# Patient Record
Sex: Male | Born: 1980
Health system: Southern US, Community
[De-identification: ages and names within clinical notes are randomized; demographics above are authoritative.]

## PROBLEM LIST (undated history)

## (undated) DIAGNOSIS — E669 Obesity, unspecified: Secondary | ICD-10-CM

## (undated) DIAGNOSIS — J45909 Unspecified asthma, uncomplicated: Secondary | ICD-10-CM

## (undated) DIAGNOSIS — J301 Allergic rhinitis due to pollen: Secondary | ICD-10-CM

## (undated) DIAGNOSIS — I10 Essential (primary) hypertension: Secondary | ICD-10-CM

## (undated) DIAGNOSIS — N200 Calculus of kidney: Secondary | ICD-10-CM

## (undated) DIAGNOSIS — M1990 Unspecified osteoarthritis, unspecified site: Secondary | ICD-10-CM

## (undated) DIAGNOSIS — B019 Varicella without complication: Secondary | ICD-10-CM

## (undated) HISTORY — DX: Varicella without complication: B01.9

## (undated) HISTORY — DX: Unspecified osteoarthritis, unspecified site: M19.90

## (undated) HISTORY — PX: TONSILECTOMY, ADENOIDECTOMY, BILATERAL MYRINGOTOMY AND TUBES: SHX2538

## (undated) HISTORY — DX: Calculus of kidney: N20.0

## (undated) HISTORY — DX: Unspecified asthma, uncomplicated: J45.909

## (undated) HISTORY — PX: APPENDECTOMY: SHX54

## (undated) HISTORY — DX: Allergic rhinitis due to pollen: J30.1

## (undated) HISTORY — DX: Essential (primary) hypertension: I10

---

## 2016-03-28 ENCOUNTER — Encounter: Payer: Self-pay | Admitting: Family Medicine

## 2016-03-28 ENCOUNTER — Ambulatory Visit (INDEPENDENT_AMBULATORY_CARE_PROVIDER_SITE_OTHER): Payer: 59 | Admitting: Family Medicine

## 2016-03-28 VITALS — BP 150/88 | HR 80 | Ht 73.0 in | Wt >= 6400 oz

## 2016-03-28 DIAGNOSIS — I10 Essential (primary) hypertension: Secondary | ICD-10-CM

## 2016-03-28 MED ORDER — LISINOPRIL-HYDROCHLOROTHIAZIDE 20-12.5 MG PO TABS: 1.0000 | ORAL_TABLET | Freq: Every day | ORAL | 1 refills | Status: DC

## 2016-03-28 NOTE — Patient Instructions (Signed)
Nice to meet you. Regarding to increase your lisinopril to 20 mg. Verlon Au going to keep. HCTZ at 12.5 mg. You will return for lab work in 1 week.

## 2016-03-28 NOTE — Progress Notes (Signed)
Tommi Rumps, MD Phone: [phone]  Arthur Oconnor is a 35 y.o. male who presents today for new patient visit.  HYPERTENSION  Disease Monitoring  Home BP Monitoring not checking consistently, reports that the doctors office it's typically 140/80     Chest pain- no    Dyspnea- no Medications  Compliance-  taking lisinopril-HCTZ. Lightheadedness-  no  Edema- no   Active Ambulatory Problems    Diagnosis Date Noted  . Essential hypertension 03/28/2016   Resolved Ambulatory Problems    Diagnosis Date Noted  . No Resolved Ambulatory Problems   Past Medical History:  Diagnosis Date  . Arthritis   . Asthma   . Chicken pox   . Hay fever   . Hypertension   . Kidney stones     Family History  Problem Relation Age of Onset  . Multiple myeloma Father   . Arthritis Maternal Grandmother   . Arthritis Paternal Grandmother     Social History   Social History  . Marital status: Married    Spouse name: N/A  . Number of children: N/A  . Years of education: N/A   Occupational History  . Not on file.   Social History Main Topics  . Smoking status: Never Smoker  . Smokeless tobacco: Former Systems developer  . Alcohol use Yes     Comment: once a month   . Drug use: No  . Sexual activity: Not on file   Other Topics Concern  . Not on file   Social History Narrative   Works as a Music therapist.     ROS  General:  Negative for nexplained weight loss, fever Skin: Negative for new or changing mole, sore that won't heal HEENT: Positive for chronic tinnitus, Negative for trouble hearing, trouble seeing, mouth sores, hoarseness, change in voice, dysphagia. CV:  Negative for chest pain, dyspnea, edema, palpitations Resp: Negative for cough, dyspnea, hemoptysis GI: Negative for nausea, vomiting, diarrhea, constipation, abdominal pain, melena, hematochezia. GU: Negative for dysuria, incontinence, urinary hesitance, hematuria, vaginal or penile discharge, polyuria, sexual  difficulty, lumps in testicle or breasts MSK: Negative for muscle cramps or aches, joint pain or swelling Neuro: Negative for headaches, weakness, numbness, dizziness, passing out/fainting Psych: Negative for depression, anxiety, memory problems  Objective  Physical Exam Vitals:   03/28/16 0808  BP: (!) 150/88  Pulse: 80    BP Readings from Last 3 Encounters:  03/28/16 (!) 150/88   Wt Readings from Last 3 Encounters:  03/28/16 (!) 478 lb (216.8 kg)    Physical Exam  Constitutional: He is well-developed, well-nourished, and in no distress.  HENT:  Head: Normocephalic and atraumatic.  Mouth/Throat: Oropharynx is clear and moist. No oropharyngeal exudate.  Eyes: Conjunctivae are normal. Pupils are equal, round, and reactive to light.  Cardiovascular: Normal rate, regular rhythm and normal heart sounds.   Pulmonary/Chest: Effort normal and breath sounds normal.  Abdominal: Soft. Bowel sounds are normal. He exhibits no distension. There is no tenderness. There is no rebound and no guarding.  Musculoskeletal: He exhibits no edema.  Neurological: He is alert. Gait normal.  Skin: Skin is warm and dry.  Psychiatric: Mood and affect normal.     Assessment/Plan:   Essential hypertension Not at goal. We will increase lisinopril to 20 mg. Keep HCTZ at 12.5 mg. I advised that he needs lab work today to evaluate his kidney function and electrolytes given that this has not been checked in some time and that he is on an ACEI though  he wanted to defer this to next week. We will check fasting lab work next week at his request. He will have a nurse visit at that time as well to recheck his blood pressure.   Orders Placed This Encounter  Procedures  . Comp Met (CMET)    Standing Status:   Future    Standing Expiration Date:   03/28/2017  . HgB A1c    Standing Status:   Future    Standing Expiration Date:   03/28/2017  . Lipid Profile    Standing Status:   Future    Standing Expiration  Date:   03/28/2017    Meds ordered this encounter  Medications  . DISCONTD: lisinopril-hydrochlorothiazide (PRINZIDE,ZESTORETIC) 10-12.5 MG tablet    Sig: Take 1 tablet by mouth daily.  Marland Kitchen lisinopril-hydrochlorothiazide (ZESTORETIC) 20-12.5 MG tablet    Sig: Take 1 tablet by mouth daily.    Dispense:  90 tablet    Refill:  1     Tommi Rumps, MD [place]

## 2016-03-28 NOTE — Assessment & Plan Note (Signed)
Not at goal. We will increase lisinopril to 20 mg. Keep HCTZ at 12.5 mg. I advised that he needs lab work today to evaluate his kidney function and electrolytes given that this has not been checked in some time and that he is on an ACEI though he wanted to defer this to next week. We will check fasting lab work next week at his request. He will have a nurse visit at that time as well to recheck his blood pressure.

## 2016-03-29 ENCOUNTER — Encounter: Payer: Self-pay | Admitting: Family Medicine

## 2016-04-10 ENCOUNTER — Other Ambulatory Visit: Payer: 59

## 2016-04-17 ENCOUNTER — Other Ambulatory Visit (INDEPENDENT_AMBULATORY_CARE_PROVIDER_SITE_OTHER): Payer: 59

## 2016-04-17 ENCOUNTER — Other Ambulatory Visit: Payer: 59

## 2016-04-17 ENCOUNTER — Ambulatory Visit (INDEPENDENT_AMBULATORY_CARE_PROVIDER_SITE_OTHER): Payer: 59

## 2016-04-17 VITALS — BP 138/78 | HR 70 | Resp 18

## 2016-04-17 DIAGNOSIS — I10 Essential (primary) hypertension: Secondary | ICD-10-CM

## 2016-04-17 LAB — COMPREHENSIVE METABOLIC PANEL
ALK PHOS: 50 U/L (ref 39–117)
ALT: 25 U/L (ref 0–53)
AST: 20 U/L (ref 0–37)
Albumin: 4.6 g/dL (ref 3.5–5.2)
BILIRUBIN TOTAL: 0.7 mg/dL (ref 0.2–1.2)
BUN: 10 mg/dL (ref 6–23)
CO2: 31 meq/L (ref 19–32)
CREATININE: 0.82 mg/dL (ref 0.40–1.50)
Calcium: 9.8 mg/dL (ref 8.4–10.5)
Chloride: 98 mEq/L (ref 96–112)
GFR: 114.97 mL/min (ref >60.00)
Glucose, Bld: 97 mg/dL (ref 70–99)
Potassium: 4.7 mEq/L (ref 3.5–5.1)
Sodium: 137 mEq/L (ref 135–145)
TOTAL PROTEIN: 7.4 g/dL (ref 6.0–8.3)

## 2016-04-17 LAB — LIPID PANEL
CHOL/HDL RATIO: 3
Cholesterol: 144 mg/dL (ref 0–200)
HDL: 46.5 mg/dL (ref >39.00)
LDL CALC: 75 mg/dL (ref 0–99)
NonHDL: 97.97
TRIGLYCERIDES: 115 mg/dL (ref 0.0–149.0)
VLDL: 23 mg/dL (ref 0.0–40.0)

## 2016-04-17 LAB — HEMOGLOBIN A1C: Hgb A1c MFr Bld: 5.3 % (ref 4.6–6.5)

## 2016-04-17 NOTE — Progress Notes (Signed)
Blood pressure is acceptable. He should continue his current medication.  Marikay AlarEric Shelsie Tijerino, M.D.

## 2016-04-17 NOTE — Progress Notes (Signed)
Patient came in for BP check.  Check BP in right upper extremities.  Patient started new job last night on nights, started taking BP meds at 2pm for that switch yesterday.  Has not checked BP at home at all. Feeling fine with increased dose from appt on the 10/24.

## 2016-04-20 ENCOUNTER — Ambulatory Visit: Payer: Self-pay | Admitting: Family Medicine

## 2016-04-20 NOTE — Progress Notes (Signed)
Message given to patient.

## 2016-09-14 DIAGNOSIS — H52223 Regular astigmatism, bilateral: Secondary | ICD-10-CM | POA: Diagnosis not present

## 2016-09-18 ENCOUNTER — Telehealth: Payer: Self-pay | Admitting: Family Medicine

## 2016-09-18 ENCOUNTER — Encounter: Payer: Self-pay | Admitting: Family Medicine

## 2016-09-18 NOTE — Telephone Encounter (Signed)
Please find out what pharmacy he wants this sent to and get him set up for follow-up. Thanks.

## 2016-09-18 NOTE — Telephone Encounter (Signed)
error 

## 2016-09-18 NOTE — Telephone Encounter (Signed)
Last OV 03/28/16 last filled 03/28/16 90 1rf patient does not have follow up scheduled

## 2016-09-19 MED ORDER — LISINOPRIL-HYDROCHLOROTHIAZIDE 20-12.5 MG PO TABS: 1.0000 | ORAL_TABLET | Freq: Every day | ORAL | 1 refills | Status: DC

## 2016-09-19 NOTE — Telephone Encounter (Signed)
Left message to return call 

## 2016-09-19 NOTE — Telephone Encounter (Signed)
Patient will call back to schedule apppointment, he uses [facility]

## 2017-03-20 ENCOUNTER — Encounter: Payer: Self-pay | Admitting: Family Medicine

## 2017-03-20 ENCOUNTER — Ambulatory Visit (INDEPENDENT_AMBULATORY_CARE_PROVIDER_SITE_OTHER): Payer: 59 | Admitting: Family Medicine

## 2017-03-20 VITALS — BP 124/84 | HR 73 | Temp 98.8°F | Wt >= 6400 oz

## 2017-03-20 DIAGNOSIS — I10 Essential (primary) hypertension: Secondary | ICD-10-CM | POA: Diagnosis not present

## 2017-03-20 DIAGNOSIS — J4520 Mild intermittent asthma, uncomplicated: Secondary | ICD-10-CM | POA: Diagnosis not present

## 2017-03-20 DIAGNOSIS — J45909 Unspecified asthma, uncomplicated: Secondary | ICD-10-CM | POA: Insufficient documentation

## 2017-03-20 MED ORDER — LISINOPRIL-HYDROCHLOROTHIAZIDE 20-12.5 MG PO TABS: 1.0000 | ORAL_TABLET | Freq: Every day | ORAL | 3 refills | Status: DC

## 2017-03-20 MED ORDER — ALBUTEROL SULFATE HFA 108 (90 BASE) MCG/ACT IN AERS: 2.0000 | INHALATION_SPRAY | Freq: Four times a day (QID) | RESPIRATORY_TRACT | 0 refills | Status: DC | PRN

## 2017-03-20 NOTE — Assessment & Plan Note (Signed)
BMI greater than 60. Encouraged dietary changes and exercise. Dietary guidelines provided. Patient has thought about bariatric surgery though he does not have the time to take off at this time. He is working his way towards that.

## 2017-03-20 NOTE — Assessment & Plan Note (Signed)
Improved on recheck. Plan for fasting labs next month. Patient deferred lab testing for renal function and electrolytes today.

## 2017-03-20 NOTE — Patient Instructions (Signed)
Nice to see you. Please monitor your asthma and if it worsens please let us know. Please work on diet and exercise changes to help you lose weight and control her blood pressure.  Diet Recommendations  Starchy (carb) foods: Bread, rice, pasta, potatoes, corn, cereal, grits, crackers, bagels, muffins, all baked goods.  (Fruits, milk, and yogurt also have carbohydrate, but most of these foods will not spike your blood sugar as the starchy foods will.)  A few fruits do cause high blood sugars; use small portions of bananas (limit to 1/2 at a time), grapes, watermelon, oranges, and most tropical fruits.    Protein foods: Meat, fish, poultry, eggs, dairy foods, and beans such as pinto and kidney beans (beans also provide carbohydrate).   1. Eat at least 3 meals and 1-2 snacks per day. Never go more than 4-5 hours while awake without eating. Eat breakfast within the first hour of getting up.   2. Limit starchy foods to TWO per meal and ONE per snack. ONE portion of a starchy  food is equal to the following:   - ONE slice of bread (or its equivalent, such as half of a hamburger bun).   - 1/2 cup of a "scoopable" starchy food such as potatoes or rice.   - 15 grams of carbohydrate as shown on food label.  3. Include at every meal: a protein food, a carb food, and vegetables and/or fruit.   - Obtain twice the volume of veg's as protein or carbohydrate foods for both lunch and dinner.   - Fresh or frozen veg's are best.   - Keep frozen veg's on hand for a quick vegetable serving.

## 2017-03-20 NOTE — Assessment & Plan Note (Signed)
Mild symptoms. Continue as needed albuterol. If he has increasing symptoms he will let us know.

## 2017-03-20 NOTE — Progress Notes (Signed)
  Tommi Rumps, MD Phone: [phone]  Arthur Oconnor is a 35 y.o. male who presents today for f/u.  Hypertension: Taking lisinopril, HCTZ. No chest pain, shortness of breath, or edema. Not checking consistently at home.  Asthma: Long history of this. Not on any controller medications. Uses his pro-air about once a week. No nighttime symptoms. Occasional wheezing during allergy season.  Obesity: Patient doesn't get any exclusive exercise though does go up and down 3 flights of stairs 30-40 times while at work. He has been trying to eat better. Cooking more at home. Healthier substitutes. Chicken as a meat.  PMH: nonsmoker.   ROS see history of present illness  Objective  Physical Exam Vitals:   03/20/17 1527 03/20/17 1559  BP: (!) 144/90 124/84  Pulse: 73   Temp: 98.8 F (37.1 C)   SpO2: 96%     BP Readings from Last 3 Encounters:  03/20/17 124/84  04/17/16 138/78  03/28/16 (!) 150/88   Wt Readings from Last 3 Encounters:  03/20/17 (!) 477 lb 6 oz (216.5 kg)  03/28/16 (!) 478 lb (216.8 kg)    Physical Exam  Constitutional: No distress.  HENT:  Head: Normocephalic and atraumatic.  Cardiovascular: Normal rate, regular rhythm and normal heart sounds.   Pulmonary/Chest: Effort normal and breath sounds normal.  Musculoskeletal: He exhibits no edema.  Neurological: He is alert. Gait normal.  Skin: He is not diaphoretic.     Assessment/Plan: Please see individual problem list.  Asthma Mild symptoms. Continue as needed albuterol. If he has increasing symptoms he will let us know.  Morbid obesity (HCC) BMI greater than 60. Encouraged dietary changes and exercise. Dietary guidelines provided. Patient has thought about bariatric surgery though he does not have the time to take off at this time. He is working his way towards that.  Essential hypertension Improved on recheck. Plan for fasting labs next month. Patient deferred lab testing for renal function  and electrolytes today.   Orders Placed This Encounter  Procedures  . Comp Met (CMET)    Standing Status:   Future    Standing Expiration Date:   03/20/2018  . Lipid Profile    Standing Status:   Future    Standing Expiration Date:   03/20/2018  . Hemoglobin A1c    Standing Status:   Future    Standing Expiration Date:   03/20/2018    Meds ordered this encounter  Medications  . lisinopril-hydrochlorothiazide (ZESTORETIC) 20-12.5 MG tablet    Sig: Take 1 tablet by mouth daily.    Dispense:  90 tablet    Refill:  3  . albuterol (PROVENTIL HFA;VENTOLIN HFA) 108 (90 Base) MCG/ACT inhaler    Sig: Inhale 2 puffs into the lungs every 6 (six) hours as needed for wheezing or shortness of breath.    Dispense:  1 Inhaler    Refill:  0     Tommi Rumps, MD [place]

## 2017-04-20 ENCOUNTER — Other Ambulatory Visit: Payer: 59

## 2017-09-18 ENCOUNTER — Ambulatory Visit: Payer: 59 | Admitting: Family Medicine

## 2018-04-01 ENCOUNTER — Other Ambulatory Visit: Payer: Self-pay | Admitting: Family Medicine

## 2018-04-01 NOTE — Telephone Encounter (Signed)
Requested medication (s) are due for refill today: Yes  Requested medication (s) are on the active medication list: Yes  Last refill:  03/20/17  Future visit scheduled: Yes  Notes to clinic:  Expired, labs overdue, unable to refill per protocol     Requested Prescriptions  Pending Prescriptions Disp Refills   lisinopril-hydrochlorothiazide (ZESTORETIC) 20-12.5 MG tablet 90 tablet 3    Sig: Take 1 tablet by mouth daily.     Cardiovascular:  ACEI + Diuretic Combos Failed - 04/01/2018  8:30 AM      Failed - Na in normal range and within 180 days    Sodium  Date Value Ref Range Status  04/17/2016 137 135 - 145 mEq/L Final         Failed - K in normal range and within 180 days    Potassium  Date Value Ref Range Status  04/17/2016 4.7 3.5 - 5.1 mEq/L Final         Failed - Cr in normal range and within 180 days    Creatinine, Ser  Date Value Ref Range Status  04/17/2016 0.82 0.40 - 1.50 mg/dL Final         Failed - Ca in normal range and within 180 days    Calcium  Date Value Ref Range Status  04/17/2016 9.8 8.4 - 10.5 mg/dL Final         Failed - Valid encounter within last 6 months    Recent Outpatient Visits          1 year ago Essential hypertension   [facility] [place] Glori Luis, MD   2 years ago Essential hypertension   [facility] [place] Glori Luis, MD      Future Appointments            In 2 months Birdie Sons Yehuda Mao, MD [facility]           Passed - Patient is not pregnant      Passed - Last BP in normal range    BP Readings from Last 1 Encounters:  03/20/17 124/84

## 2018-04-01 NOTE — Telephone Encounter (Signed)
Copied from CRM [id number]. Topic: Quick Communication - See Telephone Encounter >> Apr 01, 2018  8:16 AM Trula Slade wrote: CRM for notification. See Telephone encounter for: 04/01/18. Patient would like a refill on his lisinopril-hydrochlorothiazide (ZESTORETIC) 20-12.5 MG tablet medication until his meds refill appt on 06/21/18, and he would like to send it to his preferred pharmacy [facility].

## 2018-04-01 NOTE — Telephone Encounter (Signed)
Routed incorrectly , resending

## 2018-04-02 MED ORDER — LISINOPRIL-HYDROCHLOROTHIAZIDE 20-12.5 MG PO TABS: 1.0000 | ORAL_TABLET | Freq: Every day | ORAL | 3 refills | Status: DC

## 2018-06-21 ENCOUNTER — Encounter: Payer: Self-pay | Admitting: Family Medicine

## 2018-06-21 ENCOUNTER — Ambulatory Visit: Payer: 59 | Admitting: Family Medicine

## 2018-06-21 VITALS — BP 132/86 | HR 65 | Temp 97.4°F | Resp 17 | Ht 73.0 in | Wt >= 6400 oz

## 2018-06-21 DIAGNOSIS — J4520 Mild intermittent asthma, uncomplicated: Secondary | ICD-10-CM

## 2018-06-21 DIAGNOSIS — I10 Essential (primary) hypertension: Secondary | ICD-10-CM

## 2018-06-21 MED ORDER — LISINOPRIL-HYDROCHLOROTHIAZIDE 20-12.5 MG PO TABS: 1.0000 | ORAL_TABLET | Freq: Every day | ORAL | 3 refills | Status: DC

## 2018-06-21 NOTE — Assessment & Plan Note (Signed)
Adequately controlled for age.  Continue current medication.  Check lab work.

## 2018-06-21 NOTE — Assessment & Plan Note (Signed)
Asymptomatic.  Monitor for recurrence. 

## 2018-06-21 NOTE — Assessment & Plan Note (Addendum)
Congratulated on 17 pound weight loss.  He will continue diet and exercise.

## 2018-06-21 NOTE — Progress Notes (Signed)
  Tommi Rumps, MD Phone: [phone]  Arthur Oconnor is a 38 y.o. male who presents today for follow-up.  CC: Hypertension, obesity, asthma  Hypertension: Not checking at home.  Taking lisinopril/HCTZ.  No chest pain, shortness breath, or edema.  Obesity: Patient made recent changes to exercise and diet.  Patient has been exercising more with walking.  He cut down on coffee.  He has been taking a salad for his second meal of the day.  He works third shift.  He notes taking the salad is help with the lethargy he was having previously.  No soda or sweet tea.  Asthma: He notes this is very well controlled recently.  He is not had any issues.  No cough, wheezing, or shortness of breath.  Social History   Tobacco Use  Smoking Status Never Smoker  Smokeless Tobacco Former User     ROS see history of present illness  Objective  Physical Exam Vitals:   06/21/18 1557  BP: 132/86  Pulse: 65  Resp: 17  Temp: (!) 97.4 F (36.3 C)  SpO2: 97%    BP Readings from Last 3 Encounters:  06/21/18 132/86  03/20/17 124/84  04/17/16 138/78   Wt Readings from Last 3 Encounters:  06/21/18 (!) 460 lb (208.7 kg)  03/20/17 (!) 477 lb 6 oz (216.5 kg)  03/28/16 (!) 478 lb (216.8 kg)    Physical Exam Constitutional:      General: He is not in acute distress.    Appearance: He is not diaphoretic.  Cardiovascular:     Rate and Rhythm: Normal rate and regular rhythm.     Heart sounds: Normal heart sounds.  Pulmonary:     Effort: Pulmonary effort is normal.     Breath sounds: Normal breath sounds.  Musculoskeletal:     Right lower leg: No edema.     Left lower leg: No edema.  Skin:    General: Skin is warm and dry.  Neurological:     Mental Status: He is alert.      Assessment/Plan: Please see individual problem list.  Essential hypertension Adequately controlled for age.  Continue current medication.  Check lab work.  Asthma Asymptomatic.  Monitor for  recurrence.  Morbid obesity ([place]) Congratulated on 17 pound weight loss.  He will continue diet and exercise.   Health maintenance: Patient reports tetanus vaccination in 2015 or 2016.  Orders Placed This Encounter  Procedures  . Comp Met (CMET)  . Lipid panel  . HgB A1c    Meds ordered this encounter  Medications  . lisinopril-hydrochlorothiazide (ZESTORETIC) 20-12.5 MG tablet    Sig: Take 1 tablet by mouth daily.    Dispense:  90 tablet    Refill:  [place], MD [place]

## 2018-06-21 NOTE — Patient Instructions (Addendum)
Nice to see you. Please continue diet and exercise We will check lab work today and contact you with the results. 

## 2018-06-22 LAB — COMPREHENSIVE METABOLIC PANEL
AG Ratio: 1.5 (calc) (ref 1.0–2.5)
ALBUMIN MSPROF: 4.6 g/dL (ref 3.6–5.1)
ALKALINE PHOSPHATASE (APISO): 53 U/L (ref 40–115)
ALT: 24 U/L (ref 9–46)
AST: 20 U/L (ref 10–40)
BUN: 11 mg/dL (ref 7–25)
CO2: 26 mmol/L (ref 20–32)
Calcium: 9.7 mg/dL (ref 8.6–10.3)
Chloride: 99 mmol/L (ref 98–110)
Creat: 0.76 mg/dL (ref 0.60–1.35)
GLUCOSE: 89 mg/dL (ref 65–99)
Globulin: 3.1 g/dL (calc) (ref 1.9–3.7)
POTASSIUM: 3.8 mmol/L (ref 3.5–5.3)
Sodium: 138 mmol/L (ref 135–146)
TOTAL PROTEIN: 7.7 g/dL (ref 6.1–8.1)
Total Bilirubin: 0.8 mg/dL (ref 0.2–1.2)

## 2018-06-22 LAB — LIPID PANEL
CHOL/HDL RATIO: 3.5 (calc) (ref <5.0)
CHOLESTEROL: 159 mg/dL (ref <200)
HDL: 45 mg/dL (ref >40)
LDL CHOLESTEROL (CALC): 93 mg/dL
Non-HDL Cholesterol (Calc): 114 mg/dL (calc) (ref <130)
Triglycerides: 114 mg/dL (ref <150)

## 2018-06-22 LAB — HEMOGLOBIN A1C
Hgb A1c MFr Bld: 5.4 % of total Hgb (ref <5.7)
MEAN PLASMA GLUCOSE: 108 (calc)
eAG (mmol/L): 6 (calc)

## 2018-06-24 ENCOUNTER — Encounter: Payer: Self-pay | Admitting: Family Medicine

## 2018-09-19 ENCOUNTER — Encounter: Payer: Self-pay | Admitting: Family Medicine

## 2018-09-20 NOTE — Telephone Encounter (Signed)
Sent to PCP to advise 

## 2018-12-20 ENCOUNTER — Ambulatory Visit: Payer: 59 | Admitting: Family Medicine

## 2019-01-07 DIAGNOSIS — Z20828 Contact with and (suspected) exposure to other viral communicable diseases: Secondary | ICD-10-CM | POA: Diagnosis not present

## 2019-03-13 DIAGNOSIS — Z23 Encounter for immunization: Secondary | ICD-10-CM | POA: Diagnosis not present

## 2019-03-26 ENCOUNTER — Other Ambulatory Visit: Payer: Self-pay | Admitting: Family Medicine

## 2019-03-26 DIAGNOSIS — I10 Essential (primary) hypertension: Secondary | ICD-10-CM

## 2019-03-27 NOTE — Addendum Note (Signed)
Addended by: Leone Haven on: 03/27/2019 04:21 PM   Modules accepted: Orders

## 2019-03-27 NOTE — Telephone Encounter (Signed)
Patient has not been seen since January. He needs to have blood work every 6 months for this medication. I have placed an order for a BMET. Please get him scheduled for this in the next week or so. Thanks.

## 2019-03-28 NOTE — Telephone Encounter (Signed)
I called and left a voicemail for the patient to call the office and schedule a lab appointment in the next week or so.  Yarrow Linhart,cma

## 2019-05-28 ENCOUNTER — Encounter: Payer: Self-pay | Admitting: Family Medicine

## 2019-05-28 ENCOUNTER — Other Ambulatory Visit: Payer: Self-pay

## 2019-05-28 ENCOUNTER — Ambulatory Visit (INDEPENDENT_AMBULATORY_CARE_PROVIDER_SITE_OTHER): Payer: No Typology Code available for payment source | Admitting: Family Medicine

## 2019-05-28 DIAGNOSIS — R1013 Epigastric pain: Secondary | ICD-10-CM | POA: Diagnosis not present

## 2019-05-28 DIAGNOSIS — J4520 Mild intermittent asthma, uncomplicated: Secondary | ICD-10-CM | POA: Diagnosis not present

## 2019-05-28 DIAGNOSIS — I10 Essential (primary) hypertension: Secondary | ICD-10-CM | POA: Diagnosis not present

## 2019-05-28 MED ORDER — BLOOD PRESSURE KIT KIT: PACK | 0 refills | Status: DC

## 2019-05-28 MED ORDER — LISINOPRIL-HYDROCHLOROTHIAZIDE 20-12.5 MG PO TABS: 1.0000 | ORAL_TABLET | Freq: Every day | ORAL | 1 refills | Status: DC

## 2019-05-28 MED ORDER — ALBUTEROL SULFATE HFA 108 (90 BASE) MCG/ACT IN AERS: 2.0000 | INHALATION_SPRAY | Freq: Four times a day (QID) | RESPIRATORY_TRACT | 1 refills | Status: DC | PRN

## 2019-05-28 NOTE — Assessment & Plan Note (Addendum)
Likely gastritis related.  Will monitor given that it has improved quite a bit and if it does not continue to improve and resolve he will let us know.  Discussed consistent use of Pepcid or omeprazole over a 2-week timeframe for the symptoms.

## 2019-05-28 NOTE — Assessment & Plan Note (Signed)
Undetermined control.  He will come in for lab work.  He will have a nurse BP check.  We will continue his current medication.

## 2019-05-28 NOTE — Assessment & Plan Note (Signed)
Encouraged continued healthy diet and physical activity.

## 2019-05-28 NOTE — Assessment & Plan Note (Signed)
Rarely has symptoms.  We will refill his albuterol.

## 2019-05-28 NOTE — Progress Notes (Signed)
Virtual Visit via video Note  This visit type was conducted due to national recommendations for restrictions regarding the COVID-19 pandemic (e.g. social distancing).  This format is felt to be most appropriate for this patient at this time.  All issues noted in this document were discussed and addressed.  No physical exam was performed (except for noted visual exam findings with Video Visits).   I connected with Cristina Gong today at  8:30 AM EST by a video enabled telemedicine application and verified that I am speaking with the correct person using two identifiers. Location patient: home Location provider: work Persons participating in the virtual visit: patient, provider  I discussed the limitations, risks, security and privacy concerns of performing an evaluation and management service by telephone and the availability of in person appointments. I also discussed with the patient that there may be a patient responsible charge related to this service. The patient expressed understanding and agreed to proceed.   Reason for visit: follow-up  HPI: HYPERTENSION  Disease Monitoring  Home BP Monitoring not checking Chest pain- no    Dyspnea- no Medications  Compliance-  Taking lisinopril/hctz.  Edema- no  Asthma: Rarely uses albuterol.  No cough, wheezing, shortness of breath, or nighttime symptoms.  Epigastric pain: Patient notes this started after eating ghost pepper salsa on an empty stomach.  He had similar pain years ago when a physician told him that he may have had an ulcer.  He notes he took Tums and has been taking Pepcid intermittently and the discomfort has gone away.  No nausea, vomiting, diarrhea, or blood in his stool.  The pain has resolved though he does still have an occasional discomfort in the area that resolves with burping.  Morbid obesity: Patient is not specifically exercising though he is physically active with a remodel at his house.  He is eating home-cooked meals  with lean meats and vegetables.  He occasionally snacks a seem to be healthy snacks.   ROS: See pertinent positives and negatives per HPI.  Past Medical History:  Diagnosis Date  . Arthritis   . Asthma   . Chicken pox   . Hay fever   . Hypertension   . Kidney stones     Past Surgical History:  Procedure Laterality Date  . TONSILECTOMY, ADENOIDECTOMY, BILATERAL MYRINGOTOMY AND TUBES      Family History  Problem Relation Age of Onset  . Multiple myeloma Father   . Arthritis Maternal Grandmother   . Arthritis Paternal Grandmother     SOCIAL HX: Non-smoker   Current Outpatient Medications:  .  albuterol (VENTOLIN HFA) 108 (90 Base) MCG/ACT inhaler, Inhale 2 puffs into the lungs every 6 (six) hours as needed for wheezing or shortness of breath., Disp: 18 g, Rfl: 1 .  lisinopril-hydrochlorothiazide (ZESTORETIC) 20-12.5 MG tablet, Take 1 tablet by mouth daily., Disp: 90 tablet, Rfl: 1 .  Blood Pressure Monitoring (BLOOD PRESSURE KIT) KIT, Use once daily, Disp: 1 kit, Rfl: 0  EXAM:  VITALS per patient if applicable:  GENERAL: alert, oriented, appears well and in no acute distress  HEENT: atraumatic, conjunttiva clear, no obvious abnormalities on inspection of external nose and ears  NECK: normal movements of the head and neck  LUNGS: on inspection no signs of respiratory distress, breathing rate appears normal, no obvious gross SOB, gasping or wheezing  CV: no obvious cyanosis  MS: moves all visible extremities without noticeable abnormality  PSYCH/NEURO: pleasant and cooperative, no obvious depression or anxiety, speech and  thought processing grossly intact  ASSESSMENT AND PLAN:  Discussed the following assessment and plan:  Essential hypertension Undetermined control.  He will come in for lab work.  He will have a nurse BP check.  We will continue his current medication.  Asthma Rarely has symptoms.  We will refill his albuterol.  Morbid obesity  ([place]) Encouraged continued healthy diet and physical activity.  Epigastric pain Likely gastritis related.  Will monitor given that it has improved quite a bit and if it does not continue to improve and resolve he will let us know.  Discussed consistent use of Pepcid or omeprazole over a 2-week timeframe for the symptoms.    I discussed the assessment and treatment plan with the patient. The patient was provided an opportunity to ask questions and all were answered. The patient agreed with the plan and demonstrated an understanding of the instructions.   The patient was advised to call back or seek an in-person evaluation if the symptoms worsen or if the condition fails to improve as anticipated.    Tommi Rumps, MD

## 2019-05-29 ENCOUNTER — Telehealth: Payer: Self-pay | Admitting: Family Medicine

## 2019-05-29 NOTE — Telephone Encounter (Signed)
Pt will call back to schedule labs and follow up visits.

## 2019-08-10 ENCOUNTER — Ambulatory Visit: Payer: 59 | Attending: Internal Medicine

## 2019-08-10 DIAGNOSIS — Z23 Encounter for immunization: Secondary | ICD-10-CM

## 2019-08-10 NOTE — Progress Notes (Signed)
   Covid-19 Vaccination Clinic  Name:  Kory Rains Meneely    MRN: [id number] DOB: 03/13/1981  08/10/2019  Mr. Hamm was observed post Covid-19 immunization for 15 minutes without incident. He was provided with Vaccine Information Sheet and instruction to access the V-Safe system.   Mr. Donaho was instructed to call 911 with any severe reactions post vaccine: Marland Kitchen Difficulty breathing  . Swelling of face and throat  . A fast heartbeat  . A bad rash all over body  . Dizziness and weakness   Immunizations Administered    Name Date Dose VIS Date Route   Pfizer COVID-19 Vaccine 08/10/2019 12:45 PM 0.3 mL 05/16/2019 Intramuscular   Manufacturer: [employer], [employer]   Lot: [id number]   NDC: [address]-5

## 2019-09-02 ENCOUNTER — Ambulatory Visit: Payer: 59 | Attending: Internal Medicine

## 2019-09-02 DIAGNOSIS — Z23 Encounter for immunization: Secondary | ICD-10-CM

## 2019-09-02 NOTE — Progress Notes (Signed)
   Covid-19 Vaccination Clinic  Name:  Arthur Oconnor    MRN: [id number] DOB: 08-16-1980  09/02/2019  [name] was observed post Covid-19 immunization for 15 minutes without incident. He was provided with Vaccine Information Sheet and instruction to access the V-Safe system.   [name] was instructed to call 911 with any severe reactions post vaccine: Marland Kitchen Difficulty breathing  . Swelling of face and throat  . A fast heartbeat  . A bad rash all over body  . Dizziness and weakness   Immunizations Administered    Name Date Dose VIS Date Route   Pfizer COVID-19 Vaccine 09/02/2019  8:58 AM 0.3 mL 05/16/2019 Intramuscular   Manufacturer: [employer], [employer]   Lot: [id number]   NDC: [address]-4

## 2019-10-31 DIAGNOSIS — R1013 Epigastric pain: Secondary | ICD-10-CM | POA: Diagnosis not present

## 2019-10-31 DIAGNOSIS — R110 Nausea: Secondary | ICD-10-CM | POA: Diagnosis not present

## 2019-12-12 ENCOUNTER — Other Ambulatory Visit: Payer: Self-pay | Admitting: *Deleted

## 2019-12-12 NOTE — Patient Outreach (Signed)
[facility]) Care Management  12/12/2019  Anita Mcadory Strubel 02/03/1981 [id number]  [employer] -[employer]N [employer] Referral Received 12/12/2019 Initial Outreach 12/12/2019  Telephone Screen-Unsuccessful  RN attempted the initial outreach however unsuccessful. RN able to leave a HIPAA approved voice message requesting a call back.  Plan: Will scheduled another outreach call next week for pending services.  Elliot Cousin, RN Care Management Coordinator [facility] Main Office [phone]

## 2019-12-15 ENCOUNTER — Other Ambulatory Visit: Payer: Self-pay | Admitting: *Deleted

## 2019-12-15 NOTE — Patient Outreach (Signed)
[facility]) Care Management  12/15/2019  Arthur Oconnor Jul 22, 1980 [id number]  [facility] Referral Telephone Assessment-Unsuccessful  RN attempted outreach call today however remains unsuccessful. RN able to leave a HIPAA approved voice message requesting a call back.  PLAN: Will scheduled another outreach call over the next week or pending services.   Elliot Cousin, RN Care Management Coordinator [facility] Main Office [phone]

## 2019-12-16 ENCOUNTER — Ambulatory Visit: Payer: Self-pay | Admitting: *Deleted

## 2019-12-18 ENCOUNTER — Telehealth: Payer: Self-pay | Admitting: Family Medicine

## 2019-12-18 DIAGNOSIS — I10 Essential (primary) hypertension: Secondary | ICD-10-CM

## 2019-12-18 MED ORDER — LISINOPRIL-HYDROCHLOROTHIAZIDE 20-12.5 MG PO TABS: 1.0000 | ORAL_TABLET | Freq: Every day | ORAL | 1 refills | Status: DC

## 2019-12-18 NOTE — Telephone Encounter (Signed)
Pt needs a refill on  lisinopril-hydrochlorothiazide (ZESTORETIC) 20-12.5 MG tablet sent to [facility]

## 2019-12-19 ENCOUNTER — Other Ambulatory Visit: Payer: Self-pay | Admitting: *Deleted

## 2019-12-19 NOTE — Patient Outreach (Signed)
[facility]) Care Management  12/19/2019  Arthur Oconnor August 06, 1980 [id number]   Telephone Screen  RN spoke with pt briefly today and introduced the [facility] program and services due to his schedule. RN unable to fully screen all other medical condition at this time and pt opt to received another follow up call at a later date.  Pt receptive to this call as RN explained the purpose for the call.Pt states his provided is managing his blood pressures and declined any THN services at this time to further assist him in managing his ongoing care related. t was appreciative but again decline to to participate with [facility] in managing his HTN at this time. Pt again very grateful and aware his provider will be notified of [facility].  Plan: Will close this care and notify his provider.  Elliot Cousin, RN Care Management Coordinator [facility] Main Office [phone]

## 2020-01-30 ENCOUNTER — Ambulatory Visit: Payer: 59 | Admitting: Family Medicine

## 2020-02-27 ENCOUNTER — Other Ambulatory Visit: Payer: Self-pay

## 2020-02-27 ENCOUNTER — Telehealth (INDEPENDENT_AMBULATORY_CARE_PROVIDER_SITE_OTHER): Payer: 59 | Admitting: Family Medicine

## 2020-02-27 ENCOUNTER — Encounter: Payer: Self-pay | Admitting: Family Medicine

## 2020-02-27 DIAGNOSIS — I10 Essential (primary) hypertension: Secondary | ICD-10-CM | POA: Diagnosis not present

## 2020-02-27 DIAGNOSIS — K219 Gastro-esophageal reflux disease without esophagitis: Secondary | ICD-10-CM | POA: Diagnosis not present

## 2020-02-27 MED ORDER — SEMAGLUTIDE-WEIGHT MANAGEMENT 1 MG/0.5ML ~~LOC~~ SOAJ: 1.0000 mg | SUBCUTANEOUS | 0 refills | Status: AC

## 2020-02-27 MED ORDER — SEMAGLUTIDE-WEIGHT MANAGEMENT 1.7 MG/0.75ML ~~LOC~~ SOAJ: 1.7000 mg | SUBCUTANEOUS | 0 refills | Status: AC

## 2020-02-27 MED ORDER — SEMAGLUTIDE-WEIGHT MANAGEMENT 2.4 MG/0.75ML ~~LOC~~ SOAJ: 2.4000 mg | SUBCUTANEOUS | 0 refills | Status: DC

## 2020-02-27 MED ORDER — SEMAGLUTIDE-WEIGHT MANAGEMENT 0.25 MG/0.5ML ~~LOC~~ SOAJ: 0.2500 mg | SUBCUTANEOUS | 0 refills | Status: AC

## 2020-02-27 MED ORDER — SEMAGLUTIDE-WEIGHT MANAGEMENT 0.5 MG/0.5ML ~~LOC~~ SOAJ: 0.5000 mg | SUBCUTANEOUS | 0 refills | Status: AC

## 2020-02-27 MED ORDER — LISINOPRIL-HYDROCHLOROTHIAZIDE 20-12.5 MG PO TABS: 1.0000 | ORAL_TABLET | Freq: Every day | ORAL | 1 refills | Status: DC

## 2020-02-27 NOTE — Assessment & Plan Note (Addendum)
Discussed continued healthy diet.  Encouraged adding in some exercise.  We will see if his insurance will cover Ozempic as outlined below to help with weight loss.  He denies family or personal history of thyroid cancer, parathyroid cancer, and adrenal gland cancer.  Discussed that they have seen medullary thyroid cancer in rats though this has not been seen in humans.  Discussed there is a theoretical risk and if he develops any thyroid issues, trouble swallowing, or lumps in the anterior portion of his neck he is to let us know.  Discussed nausea is the most common side effect and if that is significantly bothersome and persistent he should let us know.  Discussed if this is not beneficial we could consider bariatric surgery.

## 2020-02-27 NOTE — Progress Notes (Signed)
Virtual Visit via video Note  This visit type was conducted due to national recommendations for restrictions regarding the COVID-19 pandemic (e.g. social distancing).  This format is felt to be most appropriate for this patient at this time.  All issues noted in this document were discussed and addressed.  No physical exam was performed (except for noted visual exam findings with Video Visits).   I connected with Arthur Oconnor today at  8:30 AM EDT by a video enabled telemedicine application or telephone and verified that I am speaking with the correct person using two identifiers. Location patient: home Location provider: work  Persons participating in the virtual visit: patient, provider  I discussed the limitations, risks, security and privacy concerns of performing an evaluation and management service by telephone and the availability of in person appointments. I also discussed with the patient that there may be a patient responsible charge related to this service. The patient expressed understanding and agreed to proceed.  Interactive audio and video telecommunications were attempted between this provider and patient, however failed, due to patient having technical difficulties OR patient did not have access to video capability.  We continued and completed visit with audio only.   Reason for visit: f/u  HPI: HYPERTENSION  Disease Monitoring  Home BP Monitoring not checking Chest pain- no    Dyspnea- no Medications  Compliance-  Taking lisinopril/HCTZ.   Edema- no  Morbid obesity: Patient notes he has been working on his diet quite a bit.  He has cut down on carbs and red meat.  He works an overnight shift that makes it difficult for him to get in specific exercise though he is quite active with house projects and walks a lot with his job.  GERD: Notes this occurred 2 to 3 months ago.  He did a virtual visit through [facility] and they started him on a PPI. He took this for 7 days and has  had no recurrence of symptoms.  He has cut down on coffee intake and that has helped significantly.  Rarely has to take Tums.     ROS: See pertinent positives and negatives per HPI.  Past Medical History:  Diagnosis Date  . Arthritis   . Asthma   . Chicken pox   . Hay fever   . Hypertension   . Kidney stones     Past Surgical History:  Procedure Laterality Date  . TONSILECTOMY, ADENOIDECTOMY, BILATERAL MYRINGOTOMY AND TUBES      Family History  Problem Relation Age of Onset  . Multiple myeloma Father   . Arthritis Maternal Grandmother   . Arthritis Paternal Grandmother     SOCIAL HX: nonsmoker   Current Outpatient Medications:  .  albuterol (VENTOLIN HFA) 108 (90 Base) MCG/ACT inhaler, Inhale 2 puffs into the lungs every 6 (six) hours as needed for wheezing or shortness of breath., Disp: 18 g, Rfl: 1 .  Blood Pressure Monitoring (BLOOD PRESSURE KIT) KIT, Use once daily, Disp: 1 kit, Rfl: 0 .  lisinopril-hydrochlorothiazide (ZESTORETIC) 20-12.5 MG tablet, Take 1 tablet by mouth daily., Disp: 90 tablet, Rfl: 1 .  Semaglutide-Weight Management 0.25 MG/0.5ML SOAJ, Inject 0.25 mg into the skin once a week for 28 days., Disp: 2 mL, Rfl: 0 .  [START ON 03/27/2020] Semaglutide-Weight Management 0.5 MG/0.5ML SOAJ, Inject 0.5 mg into the skin once a week for 28 days., Disp: 2 mL, Rfl: 0 .  [START ON 04/25/2020] Semaglutide-Weight Management 1 MG/0.5ML SOAJ, Inject 1 mg into the skin once  a week for 28 days., Disp: 2 mL, Rfl: 0 .  [START ON 05/24/2020] Semaglutide-Weight Management 1.7 MG/0.75ML SOAJ, Inject 1.7 mg into the skin once a week for 28 days., Disp: 3 mL, Rfl: 0 .  [START ON 06/22/2020] Semaglutide-Weight Management 2.4 MG/0.75ML SOAJ, Inject 2.4 mg into the skin once a week for 28 days., Disp: 3 mL, Rfl: 0  EXAM:  VITALS per patient if applicable:  GENERAL: alert, oriented, appears well and in no acute distress  HEENT: atraumatic, conjunttiva clear, no obvious  abnormalities on inspection of external nose and ears  NECK: normal movements of the head and neck  LUNGS: on inspection no signs of respiratory distress, breathing rate appears normal, no obvious gross SOB, gasping or wheezing  CV: no obvious cyanosis  MS: moves all visible extremities without noticeable abnormality  PSYCH/NEURO: pleasant and cooperative, no obvious depression or anxiety, speech and thought processing grossly intact  ASSESSMENT AND PLAN:  Discussed the following assessment and plan:  Essential hypertension Undetermined control.  He will continue lisinopril HCTZ 1 tablet daily.  He will come in for labs in the next week and a nurse BP check at that time.  Morbid obesity ([place]) Discussed continued healthy diet.  Encouraged adding in some exercise.  We will see if his insurance will cover Ozempic as outlined below to help with weight loss.  He denies family or personal history of thyroid cancer, parathyroid cancer, and adrenal gland cancer.  Discussed that they have seen medullary thyroid cancer in rats though this has not been seen in humans.  Discussed there is a theoretical risk and if he develops any thyroid issues, trouble swallowing, or lumps in the anterior portion of his neck he is to let us know.  Discussed nausea is the most common side effect and if that is significantly bothersome and persistent he should let us know.  Discussed if this is not beneficial we could consider bariatric surgery.  GERD (gastroesophageal reflux disease) Currently asymptomatic.  Encouraged him to continue on his decreased amount of coffee.  If he has recurrence of symptoms he can try PPI and if needed we can do this daily on an ongoing issue.   No orders of the defined types were placed in this encounter.   Meds ordered this encounter  Medications  . lisinopril-hydrochlorothiazide (ZESTORETIC) 20-12.5 MG tablet    Sig: Take 1 tablet by mouth daily.    Dispense:  90 tablet     Refill:  1  . Semaglutide-Weight Management 0.25 MG/0.5ML SOAJ    Sig: Inject 0.25 mg into the skin once a week for 28 days.    Dispense:  2 mL    Refill:  0  . Semaglutide-Weight Management 0.5 MG/0.5ML SOAJ    Sig: Inject 0.5 mg into the skin once a week for 28 days.    Dispense:  2 mL    Refill:  0  . Semaglutide-Weight Management 1 MG/0.5ML SOAJ    Sig: Inject 1 mg into the skin once a week for 28 days.    Dispense:  2 mL    Refill:  0  . Semaglutide-Weight Management 1.7 MG/0.75ML SOAJ    Sig: Inject 1.7 mg into the skin once a week for 28 days.    Dispense:  3 mL    Refill:  0  . Semaglutide-Weight Management 2.4 MG/0.75ML SOAJ    Sig: Inject 2.4 mg into the skin once a week for 28 days.    Dispense:  3  mL    Refill:  0     I discussed the assessment and treatment plan with the patient. The patient was provided an opportunity to ask questions and all were answered. The patient agreed with the plan and demonstrated an understanding of the instructions.   The patient was advised to call back or seek an in-person evaluation if the symptoms worsen or if the condition fails to improve as anticipated.  I provided 16 minutes of non-face-to-face time during this encounter.   Tommi Rumps, MD

## 2020-02-27 NOTE — Assessment & Plan Note (Signed)
Currently asymptomatic.  Encouraged him to continue on his decreased amount of coffee.  If he has recurrence of symptoms he can try PPI and if needed we can do this daily on an ongoing issue.

## 2020-02-27 NOTE — Assessment & Plan Note (Signed)
Undetermined control.  He will continue lisinopril HCTZ 1 tablet daily.  He will come in for labs in the next week and a nurse BP check at that time.

## 2020-03-11 ENCOUNTER — Encounter: Payer: Self-pay | Admitting: Family Medicine

## 2020-03-23 ENCOUNTER — Telehealth: Payer: Self-pay | Admitting: Family Medicine

## 2020-03-23 DIAGNOSIS — I10 Essential (primary) hypertension: Secondary | ICD-10-CM

## 2020-03-23 NOTE — Telephone Encounter (Signed)
This patient was supposed to come in for labs and a nurse BP check previously.  Can you contact him and get him scheduled for this?  It looks like he was never called after his recent virtual visit.  Lab orders were previously placed.

## 2020-03-23 NOTE — Telephone Encounter (Signed)
I called and spoke with the patient to schedule a nurse visit and  BP check and he was driving and stated he would call back to schedule.  Karsynn Deweese,cma

## 2020-03-25 NOTE — Telephone Encounter (Signed)
Please follow-up with the patient to make sure these labs are scheduled.

## 2020-05-05 NOTE — Telephone Encounter (Signed)
Called and LVM for the patient to call back and schedule a lab appt. And a nurse visit for a BP check.  Reginald Mangels,cma

## 2020-05-18 NOTE — Telephone Encounter (Signed)
I called and lvm for the patient to call and schedule a lab appt. And a nurse visit to check his BP.  Dailey Alberson,cma

## 2020-05-24 NOTE — Telephone Encounter (Addendum)
Please try calling him again.  He is overdue for lab work.  If he does not come in for labs I will not be able to refill his blood pressure medication moving forward.  If you cannot reach him please mail a letter advising him of this.  Thanks.

## 2020-05-24 NOTE — Telephone Encounter (Signed)
I called and spoke with the wife and informed her that the patient needs to have labs in order to get refills on BP medication, she stated patient was asleep , he works 3rd shift but she would tell him to call and schedule a lab appointment. Johannah Rozas,cma

## 2020-06-01 NOTE — Addendum Note (Signed)
Addended by: Birdie Sons, Freida Nebel G on: 06/01/2020 10:02 AM   Modules accepted: Orders

## 2020-06-01 NOTE — Telephone Encounter (Signed)
My chart sent to remind patient

## 2020-06-01 NOTE — Telephone Encounter (Signed)
This patient has still not scheduled his labs.  Please contact him to get this scheduled.  Orders placed. His medication can not be refilled moving forward until his labs are completed. I will forward to [facility] as well so that he is aware that the patient can not get refills until his labs are completed.

## 2020-06-10 DIAGNOSIS — Z20822 Contact with and (suspected) exposure to covid-19: Secondary | ICD-10-CM | POA: Diagnosis not present

## 2020-07-19 ENCOUNTER — Other Ambulatory Visit: Payer: Self-pay

## 2020-07-19 ENCOUNTER — Other Ambulatory Visit (INDEPENDENT_AMBULATORY_CARE_PROVIDER_SITE_OTHER): Payer: 59

## 2020-07-19 ENCOUNTER — Encounter: Payer: Self-pay | Admitting: Family Medicine

## 2020-07-19 DIAGNOSIS — I10 Essential (primary) hypertension: Secondary | ICD-10-CM

## 2020-07-19 LAB — COMPREHENSIVE METABOLIC PANEL
ALT: 22 U/L (ref 0–53)
AST: 16 U/L (ref 0–37)
Albumin: 4.3 g/dL (ref 3.5–5.2)
Alkaline Phosphatase: 47 U/L (ref 39–117)
BUN: 11 mg/dL (ref 6–23)
CO2: 33 mEq/L — ABNORMAL HIGH (ref 19–32)
Calcium: 9.4 mg/dL (ref 8.4–10.5)
Chloride: 100 mEq/L (ref 96–112)
Creatinine, Ser: 0.79 mg/dL (ref 0.40–1.50)
GFR: 113.65 mL/min (ref >60.00)
Glucose, Bld: 88 mg/dL (ref 70–99)
Potassium: 4.3 mEq/L (ref 3.5–5.1)
Sodium: 139 mEq/L (ref 135–145)
Total Bilirubin: 0.7 mg/dL (ref 0.2–1.2)
Total Protein: 7.3 g/dL (ref 6.0–8.3)

## 2020-07-19 LAB — LIPID PANEL
Cholesterol: 132 mg/dL (ref 0–200)
HDL: 45.7 mg/dL (ref >39.00)
LDL Cholesterol: 70 mg/dL (ref 0–99)
NonHDL: 85.92
Total CHOL/HDL Ratio: 3
Triglycerides: 82 mg/dL (ref 0.0–149.0)
VLDL: 16.4 mg/dL (ref 0.0–40.0)

## 2020-07-19 LAB — HEMOGLOBIN A1C: Hgb A1c MFr Bld: 5.3 % (ref 4.6–6.5)

## 2020-07-22 ENCOUNTER — Other Ambulatory Visit (HOSPITAL_COMMUNITY): Payer: Self-pay | Admitting: Family Medicine

## 2020-07-22 ENCOUNTER — Telehealth: Payer: Self-pay | Admitting: Family Medicine

## 2020-07-22 MED ORDER — LISINOPRIL-HYDROCHLOROTHIAZIDE 20-12.5 MG PO TABS: 1.0000 | ORAL_TABLET | Freq: Every day | ORAL | 1 refills | Status: DC

## 2020-07-22 NOTE — Telephone Encounter (Signed)
I tried to send the patient a MyChart message though it said it was not deliverable.  Please call him and let him know that he is due for follow-up for the wegovy.  He needs to be scheduled prior to this being refilled.

## 2020-07-22 NOTE — Telephone Encounter (Signed)
-----   Message from Mychart, Generic sent at 07/22/2020  2:37 PM EST ----- Regarding: Your message may not be read Contact: [phone]    ----- Delivery failure of internet email alert  Tickler type: Message Message Id(WMG): [id number] SMTP Response: [id number] Patient: Arthur Oconnor) Internet alert email: [email or web address]     ----- Original WMG message to the patient ----- Sent: 07/22/2020  2:08 PM From: Glori Luis, MD To: Arthur Oconnor Message Type: Patient Medical Advice Request Subject: Question regarding COMPREHENSIVE METABOLIC PANEL You are due for a follow-up for the wegovy refill. Can you call to schedule an in person follow-up and then I will refill to cover until you are seen? I will send in your lisinopril.   ----- Message -----      From:Arthur Oconnor      Sent:07/20/2020  2:05 PM EST        UR:KYHC Birdie Sons, MD   Subject:Question regarding COMPREHENSIVE METABOLIC PANEL  Sounds like a plan.  Speaking of weight and exercise, that is going well.  I am down 16lbs from the last known weigh in at the office (I stepped on the scale quickly before I left the office) but down 28lbs from when I started taking the [facility] and weighing myself at work.  The [facility] certainly curbs my appetite and greatly limits my ability to eat a lot.  Speaking of Wegovy,  I believe I need a refill since I am on the last dose in the series and I need a refill on the Lisinopril.   ----- Message -----      From:Arthur Wittmann Birdie Sons, MD      Sent:07/20/2020  1:35 PM EST        WC:BJSE Arthur Oconnor   Subject:Question regarding COMPREHENSIVE METABOLIC PANEL  Your bicarbonate level (Co2) was just minimally elevated. It is certainly possible this is within lab error range. If you have had recent vomiting that could contribute to it. Sometimes lung issues can contribute and your weight could be causing some restrictive lung issues that could contribute as well.  At this time I do not think there is anything to do for this other than work on diet and exercise to try to lose weight. We can plan on rechecking with your next set of labs.    ----- Message -----      From:Arthur Oconnor      Sent:07/19/2020  4:15 PM EST        GB:TDVV Birdie Sons, MD   Subject:Question regarding COMPREHENSIVE METABOLIC PANEL  I noticed my Co2 level is high.  What could this be from and what can I do to improve this?

## 2020-07-22 NOTE — Telephone Encounter (Signed)
Patient was called and given his results and he is scheduled to follow up with the provider.  Ashiyah Pavlak,cma

## 2020-07-22 NOTE — Telephone Encounter (Signed)
I called and LVM informing the patient that the provider wanted to see him in person to renew his Wegovy, I held a 3:15 spot for the patient this Friday and informed him to call back and let us know if he would make that appointment.  Jeiry Birnbaum,cma

## 2020-07-27 ENCOUNTER — Telehealth: Payer: Self-pay | Admitting: Family Medicine

## 2020-07-27 ENCOUNTER — Other Ambulatory Visit (HOSPITAL_COMMUNITY): Payer: Self-pay | Admitting: Family Medicine

## 2020-07-27 MED ORDER — SEMAGLUTIDE-WEIGHT MANAGEMENT 2.4 MG/0.75ML ~~LOC~~ SOAJ
2.4000 mg | SUBCUTANEOUS | 1 refills | Status: DC
Start: 2020-07-27 — End: 2020-08-31

## 2020-07-27 NOTE — Telephone Encounter (Signed)
LVM for the patient to call back and let us know if he wanted to renew his medication Wegovy by seeing the provider soon or did he want to wait until his appointment in April to renew.  Ivann Trimarco,cma

## 2020-07-27 NOTE — Addendum Note (Signed)
Addended by: Glori Luis on: 07/27/2020 06:29 PM   Modules accepted: Orders

## 2020-07-27 NOTE — Telephone Encounter (Signed)
Patient called in stated that he will be out of medication Wegovy by friday  He is on schedule for 3-29 at 8am

## 2020-08-19 ENCOUNTER — Other Ambulatory Visit: Payer: Self-pay | Admitting: Family Medicine

## 2020-08-19 DIAGNOSIS — I10 Essential (primary) hypertension: Secondary | ICD-10-CM

## 2020-08-27 ENCOUNTER — Other Ambulatory Visit: Payer: Self-pay

## 2020-08-31 ENCOUNTER — Other Ambulatory Visit (HOSPITAL_COMMUNITY): Payer: Self-pay | Admitting: Family Medicine

## 2020-08-31 ENCOUNTER — Other Ambulatory Visit: Payer: Self-pay

## 2020-08-31 ENCOUNTER — Ambulatory Visit (INDEPENDENT_AMBULATORY_CARE_PROVIDER_SITE_OTHER): Payer: 59 | Admitting: Family Medicine

## 2020-08-31 ENCOUNTER — Encounter: Payer: Self-pay | Admitting: Family Medicine

## 2020-08-31 DIAGNOSIS — I10 Essential (primary) hypertension: Secondary | ICD-10-CM | POA: Diagnosis not present

## 2020-08-31 MED ORDER — SEMAGLUTIDE-WEIGHT MANAGEMENT 2.4 MG/0.75ML ~~LOC~~ SOAJ: 2.4000 mg | SUBCUTANEOUS | 2 refills | Status: DC

## 2020-08-31 NOTE — Progress Notes (Signed)
Tommi Rumps, MD Phone: [phone]  Arthur Oconnor is a 40 y.o. male who presents today for f/u.  HYPERTENSION  Disease Monitoring  Home BP Monitoring not checking Chest pain- no    Dyspnea- no Medications  Compliance-  Lisinopril, HCTZ. Edema- no  Obesity: The patient notes he feels as though his weight is down more than the 18 pounds that we have recorded.  He feels as though he was above 460 previously.  Diet is consistently healthy foods with vegetables and fruits.  Not very much junk food.  He has difficulty eating on a normal schedule as he works third shift and has a child so does not get much sleep.  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  He has been on Ozempic since January and notes he has done fairly well with this.  He had a little nausea midway through the ramp-up though that has resolved.  No abdominal pain.  He does have diarrhea for 1.5 days after taking the injection though no other issues with this.  No blood in the stool with diarrhea.  Social History   Tobacco Use  Smoking Status Never Smoker  Smokeless Tobacco Former Systems developer    Current Outpatient Medications on File Prior to Visit  Medication Sig Dispense Refill  . albuterol (VENTOLIN HFA) 108 (90 Base) MCG/ACT inhaler Inhale 2 puffs into the lungs every 6 (six) hours as needed for wheezing or shortness of breath. 18 g 1  . Blood Pressure Monitoring (BLOOD PRESSURE KIT) KIT Use once daily 1 kit 0  . lisinopril-hydrochlorothiazide (ZESTORETIC) 20-12.5 MG tablet TAKE 1 TABLET BY MOUTH ONCE DAILY 90 tablet 1   No current facility-administered medications on file prior to visit.     ROS see history of present illness  Objective  Physical Exam Vitals:   08/31/20 0820  BP: 140/78  Pulse: 74  Temp: 98.5 F (36.9 C)  SpO2: 98%    BP Readings from Last 3 Encounters:  08/31/20 140/78  06/21/18 132/86  03/20/17 124/84   Wt Readings from Last 3 Encounters:   08/31/20 (!) 442 lb 9.6 oz (200.8 kg)  02/27/20 (!) 460 lb (208.7 kg)  05/28/19 (!) 460 lb (208.7 kg)    Physical Exam Constitutional:      General: He is not in acute distress.    Appearance: He is not diaphoretic.  Cardiovascular:     Rate and Rhythm: Normal rate and regular rhythm.     Heart sounds: Normal heart sounds.  Pulmonary:     Effort: Pulmonary effort is normal.     Breath sounds: Normal breath sounds.  Musculoskeletal:     Right lower leg: No edema.     Left lower leg: No edema.  Skin:    General: Skin is warm and dry.  Neurological:     Mental Status: He is alert.      Assessment/Plan: Please see individual problem list.  Problem List Items Addressed This Visit    Essential hypertension    Generally okay control.  He is relatively young and is working on weight loss so his current blood pressure is acceptable at this time.  He will continue to work on diet and exercise and weight loss.  He will continue lisinopril-HCTZ 1 tablet once daily.  He will start checking his blood pressure.  If it starts to run any higher than it is here today he will let us know we can increase his lisinopril dosage.  Morbid obesity ([place])    Weight is trending down.  He remains as active as he is able to with the schedule.  He is eating a healthy diet.  We can continue the Ozempic.  He denies a personal and family history of thyroid cancer, parathyroid cancer, and adrenal gland cancer.  Discussed the risk of medullary thyroid cancer seen in rats studies.  Suspect there is minimal risk in humans.  Advised him to let us know if he develops any issues in his thyroid area in his neck.      Relevant Medications   Semaglutide-Weight Management 2.4 MG/0.75ML SOAJ      This visit occurred during the SARS-CoV-2 public health emergency.  Safety protocols were in place, including screening questions prior to the visit, additional usage of staff PPE, and extensive cleaning of exam room  while observing appropriate contact time as indicated for disinfecting solutions.    Tommi Rumps, MD [facility]

## 2020-08-31 NOTE — Assessment & Plan Note (Signed)
Weight is trending down.  He remains as active as he is able to with the schedule.  He is eating a healthy diet.  We can continue the Ozempic.  He denies a personal and family history of thyroid cancer, parathyroid cancer, and adrenal gland cancer.  Discussed the risk of medullary thyroid cancer seen in rats studies.  Suspect there is minimal risk in humans.  Advised him to let us know if he develops any issues in his thyroid area in his neck.

## 2020-08-31 NOTE — Assessment & Plan Note (Signed)
Generally okay control.  He is relatively young and is working on weight loss so his current blood pressure is acceptable at this time.  He will continue to work on diet and exercise and weight loss.  He will continue lisinopril-HCTZ 1 tablet once daily.  He will start checking his blood pressure.  If it starts to run any higher than it is here today he will let us know we can increase his lisinopril dosage.

## 2020-08-31 NOTE — Patient Instructions (Signed)
Nice to see you. Please continue to work on diet and exercise. Please start monitoring your blood pressure.  If you start to run greater than 140/80 consistently please let us know.

## 2020-09-20 ENCOUNTER — Ambulatory Visit: Payer: 59 | Admitting: Family Medicine

## 2020-09-22 ENCOUNTER — Other Ambulatory Visit: Payer: Self-pay

## 2020-09-22 MED FILL — Semaglutide (Weight Mngmt) Soln Auto-Injector 2.4 MG/0.75ML: SUBCUTANEOUS | 28 days supply | Qty: 3 | Fill #0 | Status: AC

## 2020-09-23 ENCOUNTER — Other Ambulatory Visit: Payer: Self-pay

## 2020-10-21 ENCOUNTER — Telehealth: Payer: Self-pay | Admitting: Family Medicine

## 2020-10-21 DIAGNOSIS — R69 Illness, unspecified: Secondary | ICD-10-CM | POA: Diagnosis not present

## 2020-10-21 NOTE — Telephone Encounter (Signed)
Patient was called and he stated he has an appointment at an urgent care to get tested.  Leno Mathes,cma

## 2020-10-21 NOTE — Telephone Encounter (Signed)
PT is wanting to know if they can have a telephone call done for a order to be place in to have a STD test done asap.

## 2020-10-25 ENCOUNTER — Other Ambulatory Visit: Payer: Self-pay

## 2020-10-25 DIAGNOSIS — Z315 Encounter for genetic counseling: Secondary | ICD-10-CM | POA: Diagnosis not present

## 2020-10-25 DIAGNOSIS — Z31448 Encounter for other genetic testing of male for procreative management: Secondary | ICD-10-CM | POA: Diagnosis not present

## 2020-10-25 MED FILL — Semaglutide (Weight Mngmt) Soln Auto-Injector 2.4 MG/0.75ML: SUBCUTANEOUS | 28 days supply | Qty: 3 | Fill #1 | Status: AC

## 2020-11-16 ENCOUNTER — Ambulatory Visit: Payer: 59 | Admitting: Family Medicine

## 2020-11-19 ENCOUNTER — Other Ambulatory Visit: Payer: Self-pay

## 2020-11-19 MED FILL — Semaglutide (Weight Mngmt) Soln Auto-Injector 2.4 MG/0.75ML: SUBCUTANEOUS | 28 days supply | Qty: 3 | Fill #0 | Status: AC

## 2020-12-01 ENCOUNTER — Ambulatory Visit: Payer: 59

## 2020-12-01 ENCOUNTER — Ambulatory Visit: Payer: 59 | Admitting: Family Medicine

## 2020-12-01 ENCOUNTER — Ambulatory Visit
Admission: RE | Admit: 2020-12-01 | Discharge: 2020-12-01 | Disposition: A | Discharge status: Home / Self Care | Payer: 59 | Source: Ambulatory Visit | Attending: Family Medicine | Admitting: Family Medicine

## 2020-12-01 ENCOUNTER — Encounter: Payer: Self-pay | Admitting: Family Medicine

## 2020-12-01 ENCOUNTER — Other Ambulatory Visit: Payer: Self-pay | Admitting: Family Medicine

## 2020-12-01 ENCOUNTER — Other Ambulatory Visit: Payer: Self-pay

## 2020-12-01 VITALS — BP 110/80 | HR 82 | Temp 98.3°F | Ht 73.0 in | Wt >= 6400 oz

## 2020-12-01 DIAGNOSIS — M545 Low back pain, unspecified: Secondary | ICD-10-CM

## 2020-12-01 DIAGNOSIS — G8929 Other chronic pain: Secondary | ICD-10-CM | POA: Diagnosis not present

## 2020-12-01 DIAGNOSIS — R8290 Unspecified abnormal findings in urine: Secondary | ICD-10-CM

## 2020-12-01 DIAGNOSIS — I10 Essential (primary) hypertension: Secondary | ICD-10-CM | POA: Diagnosis not present

## 2020-12-01 DIAGNOSIS — M5136 Other intervertebral disc degeneration, lumbar region: Secondary | ICD-10-CM | POA: Insufficient documentation

## 2020-12-01 DIAGNOSIS — M47816 Spondylosis without myelopathy or radiculopathy, lumbar region: Secondary | ICD-10-CM | POA: Diagnosis not present

## 2020-12-01 NOTE — Progress Notes (Signed)
Arthur Sonnenberg, MD Phone: [phone]  Arthur Oconnor is a 39 y.o. male who presents today for follow-up.  Obesity: Patient has been on Wegovy.  He notes no nausea or abdominal pain.  He got off his diet for a while though he has been eating better more recently.  He is now on a day shift and is able to eat more consistent meals.  He is much more active at his job and is walking quite a bit.  Hypertension: Not checking his blood pressure.  He does take lisinopril-HCTZ.  No chest pain, shortness of breath, or edema.  Low back pain: This has been going on for about 3 months.  It is in his right low back.  Hurts most when he is sitting on the floor.  Gets a pulling sensation.  He has a history of kidney stones and does note he had a recent urinalysis with 2+ blood on the urinalysis.  He does note occasionally the discomfort radiates around to his right flank.  No numbness, weakness, or incontinence.  He does report falling through a floor about 4 months ago.  Social History   Tobacco Use  Smoking Status Never  Smokeless Tobacco Former    Current Outpatient Medications on File Prior to Visit  Medication Sig Dispense Refill   albuterol (VENTOLIN HFA) 108 (90 Base) MCG/ACT inhaler Inhale 2 puffs into the lungs every 6 (six) hours as needed for wheezing or shortness of breath. 18 g 1   Blood Pressure Monitoring (BLOOD PRESSURE KIT) KIT Use once daily 1 kit 0   lisinopril-hydrochlorothiazide (ZESTORETIC) 20-12.5 MG tablet TAKE 1 TABLET BY MOUTH ONCE DAILY 90 tablet 1   Semaglutide-Weight Management 2.4 MG/0.75ML SOAJ INJECT 2.4 MG INTO THE SKIN ONCE A WEEK. 3 mL 2   lisinopril-hydrochlorothiazide (ZESTORETIC) 20-12.5 MG tablet TAKE 1 TABLET BY MOUTH DAILY. (Patient not taking: Reported on 12/01/2020) 90 tablet 1   Semaglutide-Weight Management 2.4 MG/0.75ML SOAJ Inject 2.4 mg into the skin once a week. (Patient not taking: Reported on 12/01/2020) 3 mL 2   Semaglutide-Weight Management  2.4 MG/0.75ML SOAJ INJECT 2.4 MG INTO THE SKIN ONCE A WEEK. (Patient not taking: Reported on 12/01/2020) 3 mL 1   No current facility-administered medications on file prior to visit.     ROS see history of present illness  Objective  Physical Exam Vitals:   12/01/20 1601  BP: 110/80  Pulse: 82  Temp: 98.3 F (36.8 C)  SpO2: 98%    BP Readings from Last 3 Encounters:  12/01/20 110/80  08/31/20 140/78  06/21/18 132/86   Wt Readings from Last 3 Encounters:  12/01/20 (!) 429 lb 6.4 oz (194.8 kg)  08/31/20 (!) 442 lb 9.6 oz (200.8 kg)  02/27/20 (!) 460 lb (208.7 kg)    Physical Exam Constitutional:      General: He is not in acute distress.    Appearance: He is not diaphoretic.  Cardiovascular:     Rate and Rhythm: Normal rate and regular rhythm.     Heart sounds: Normal heart sounds.  Pulmonary:     Effort: Pulmonary effort is normal.     Breath sounds: Normal breath sounds.  Musculoskeletal:     Comments: No midline spine tenderness, no midline spine step-off, no muscular back tenderness  Skin:    General: Skin is warm and dry.  Neurological:     Mental Status: He is alert.     Comments: 5/5 strength bilateral quads, hamstrings, plantar flexion, and dorsiflexion, sensation   to light touch intact bilateral lower extremities     Assessment/Plan: Please see individual problem list.  Problem List Items Addressed This Visit     Abnormal urinalysis    Patient reports 2+ blood on recent urine check.  We will recheck his urine as it does not sound as though a microscopy was completed.  He opted to return to have this completed at a later date.       Relevant Orders   POCT urinalysis dipstick   Essential hypertension    Very well controlled.  He will continue lisinopril-HCTZ 1 tablet daily.       Low back pain - Primary    Possibly related to falling to the floor or some other injury.  Less likely a kidney stone based on his description.  We will get an x-ray  given the duration.  This will help [place] determine the next step in management.       Relevant Orders   DG Lumbar Spine Complete   Morbid obesity ([place])    He has demonstrated adequate weight loss.  He will continue Wegovy 2.4 mg once weekly.  He will continue with diet and exercise changes and a goal to continue to lose weight.        Return in about 2 weeks (around 12/15/2020) for Labs, 3 months PCP.  This visit occurred during the SARS-CoV-2 public health emergency.  Safety protocols were in place, including screening questions prior to the visit, additional usage of staff PPE, and extensive cleaning of exam room while observing appropriate contact time as indicated for disinfecting solutions.    Tommi Rumps, MD [place]

## 2020-12-01 NOTE — Patient Instructions (Signed)
Nice to see you. We will get an x-ray today. We will schedule you for a urine test to be completed. Please continue with the [facility]. Please continue with healthy diet and exercise.

## 2020-12-02 DIAGNOSIS — M545 Low back pain, unspecified: Secondary | ICD-10-CM | POA: Insufficient documentation

## 2020-12-02 DIAGNOSIS — R8290 Unspecified abnormal findings in urine: Secondary | ICD-10-CM | POA: Insufficient documentation

## 2020-12-02 NOTE — Assessment & Plan Note (Signed)
He has demonstrated adequate weight loss.  He will continue Wegovy 2.4 mg once weekly.  He will continue with diet and exercise changes and a goal to continue to lose weight.

## 2020-12-02 NOTE — Assessment & Plan Note (Signed)
Possibly related to falling to the floor or some other injury.  Less likely a kidney stone based on his description.  We will get an x-ray given the duration.  This will help [place] determine the next step in management.

## 2020-12-02 NOTE — Assessment & Plan Note (Signed)
Very well controlled.  He will continue lisinopril-HCTZ 1 tablet daily.

## 2020-12-02 NOTE — Assessment & Plan Note (Signed)
Patient reports 2+ blood on recent urine check.  We will recheck his urine as it does not sound as though a microscopy was completed.  He opted to return to have this completed at a later date.

## 2020-12-03 ENCOUNTER — Encounter: Payer: Self-pay | Admitting: Family Medicine

## 2020-12-03 DIAGNOSIS — G8929 Other chronic pain: Secondary | ICD-10-CM

## 2020-12-15 ENCOUNTER — Other Ambulatory Visit: Payer: Self-pay

## 2020-12-15 ENCOUNTER — Other Ambulatory Visit: Payer: Self-pay | Admitting: Family Medicine

## 2020-12-15 ENCOUNTER — Other Ambulatory Visit: Payer: 59

## 2020-12-15 MED FILL — Semaglutide (Weight Mngmt) Soln Auto-Injector 2.4 MG/0.75ML: SUBCUTANEOUS | 28 days supply | Qty: 3 | Fill #0 | Status: AC

## 2020-12-16 ENCOUNTER — Other Ambulatory Visit: Payer: Self-pay

## 2020-12-27 ENCOUNTER — Telehealth: Payer: Self-pay | Admitting: *Deleted

## 2020-12-27 NOTE — Telephone Encounter (Signed)
Please place future orders for lab appt.    Per last OV: Return in about 2 weeks (around 12/15/2020) for Labs, 3 months PCP.

## 2020-12-27 NOTE — Telephone Encounter (Signed)
This was for a urinalysis.  It looks like there is a future urinalysis already ordered.  Please let me know if I need to place an order for this again.  Thanks.

## 2020-12-28 ENCOUNTER — Ambulatory Visit: Payer: 59 | Attending: Family Medicine | Admitting: Physical Therapy

## 2020-12-28 ENCOUNTER — Other Ambulatory Visit: Payer: Self-pay | Admitting: Family Medicine

## 2020-12-28 ENCOUNTER — Other Ambulatory Visit: Payer: Self-pay

## 2020-12-28 ENCOUNTER — Other Ambulatory Visit (INDEPENDENT_AMBULATORY_CARE_PROVIDER_SITE_OTHER): Payer: 59

## 2020-12-28 VITALS — BP 132/71 | HR 78

## 2020-12-28 DIAGNOSIS — R8290 Unspecified abnormal findings in urine: Secondary | ICD-10-CM | POA: Diagnosis not present

## 2020-12-28 DIAGNOSIS — M545 Low back pain, unspecified: Secondary | ICD-10-CM | POA: Diagnosis not present

## 2020-12-28 LAB — POCT URINALYSIS DIPSTICK
Bilirubin, UA: NEGATIVE
Glucose, UA: NEGATIVE
Ketones, UA: NEGATIVE
Leukocytes, UA: NEGATIVE
Nitrite, UA: NEGATIVE
Protein, UA: NEGATIVE
Spec Grav, UA: 1.02 (ref 1.010–1.025)
Urobilinogen, UA: 0.2 E.U./dL
pH, UA: 5.5 (ref 5.0–8.0)

## 2020-12-28 LAB — URINALYSIS, MICROSCOPIC ONLY
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
## 2020-12-28 NOTE — Addendum Note (Signed)
Addended by: Warden Fillers on: 12/28/2020 04:25 PM   Modules accepted: Orders

## 2020-12-29 ENCOUNTER — Other Ambulatory Visit: Payer: 59

## 2020-12-29 ENCOUNTER — Encounter: Payer: Self-pay | Admitting: Family Medicine

## 2020-12-30 ENCOUNTER — Ambulatory Visit: Payer: 59 | Admitting: Physical Therapy

## 2021-01-03 ENCOUNTER — Ambulatory Visit: Payer: 59 | Admitting: Physical Therapy

## 2021-01-05 ENCOUNTER — Ambulatory Visit: Payer: 59 | Admitting: Physical Therapy

## 2021-01-10 ENCOUNTER — Encounter: Payer: 59 | Admitting: Physical Therapy

## 2021-01-12 ENCOUNTER — Encounter: Payer: 59 | Admitting: Physical Therapy

## 2021-01-17 ENCOUNTER — Other Ambulatory Visit: Payer: Self-pay

## 2021-01-17 ENCOUNTER — Encounter: Payer: 59 | Admitting: Physical Therapy

## 2021-01-17 MED FILL — Semaglutide (Weight Mngmt) Soln Auto-Injector 2.4 MG/0.75ML: SUBCUTANEOUS | 28 days supply | Qty: 3 | Fill #1 | Status: AC

## 2021-01-19 ENCOUNTER — Encounter: Payer: 59 | Admitting: Physical Therapy

## 2021-01-24 ENCOUNTER — Encounter: Payer: 59 | Admitting: Physical Therapy

## 2021-01-26 ENCOUNTER — Encounter: Payer: 59 | Admitting: Physical Therapy

## 2021-01-31 ENCOUNTER — Encounter: Payer: 59 | Admitting: Physical Therapy

## 2021-02-02 ENCOUNTER — Encounter: Payer: 59 | Admitting: Physical Therapy

## 2021-02-15 ENCOUNTER — Other Ambulatory Visit: Payer: Self-pay | Admitting: Family Medicine

## 2021-02-15 ENCOUNTER — Other Ambulatory Visit: Payer: Self-pay

## 2021-02-16 ENCOUNTER — Other Ambulatory Visit: Payer: Self-pay

## 2021-02-16 MED FILL — Semaglutide (Weight Mngmt) Soln Auto-Injector 2.4 MG/0.75ML: SUBCUTANEOUS | 28 days supply | Qty: 3 | Fill #0 | Status: AC

## 2021-03-15 ENCOUNTER — Other Ambulatory Visit: Payer: Self-pay

## 2021-03-15 MED FILL — Semaglutide (Weight Mngmt) Soln Auto-Injector 2.4 MG/0.75ML: SUBCUTANEOUS | 28 days supply | Qty: 3 | Fill #1 | Status: AC

## 2021-04-05 DIAGNOSIS — R1013 Epigastric pain: Secondary | ICD-10-CM | POA: Diagnosis not present

## 2021-04-14 ENCOUNTER — Other Ambulatory Visit: Payer: Self-pay

## 2021-04-14 ENCOUNTER — Other Ambulatory Visit: Payer: Self-pay | Admitting: Family Medicine

## 2021-04-14 MED FILL — Semaglutide (Weight Mngmt) Soln Auto-Injector 2.4 MG/0.75ML: SUBCUTANEOUS | 28 days supply | Qty: 3 | Fill #0 | Status: AC

## 2021-04-21 DIAGNOSIS — Z23 Encounter for immunization: Secondary | ICD-10-CM | POA: Diagnosis not present

## 2021-05-16 ENCOUNTER — Encounter: Payer: Self-pay | Admitting: Family Medicine

## 2021-05-16 ENCOUNTER — Other Ambulatory Visit: Payer: Self-pay | Admitting: Family Medicine

## 2021-05-16 ENCOUNTER — Ambulatory Visit: Payer: 59 | Admitting: Family Medicine

## 2021-05-16 ENCOUNTER — Other Ambulatory Visit: Payer: Self-pay

## 2021-05-16 VITALS — BP 130/80 | HR 71 | Temp 98.2°F | Ht 73.0 in | Wt >= 6400 oz

## 2021-05-16 DIAGNOSIS — R1013 Epigastric pain: Secondary | ICD-10-CM | POA: Diagnosis not present

## 2021-05-16 DIAGNOSIS — Z3009 Encounter for other general counseling and advice on contraception: Secondary | ICD-10-CM | POA: Diagnosis not present

## 2021-05-16 DIAGNOSIS — I10 Essential (primary) hypertension: Secondary | ICD-10-CM | POA: Diagnosis not present

## 2021-05-16 MED ORDER — ESOMEPRAZOLE MAGNESIUM 20 MG PO CPDR
20.0000 mg | DELAYED_RELEASE_CAPSULE | Freq: Every day | ORAL | 3 refills | Status: DC
  Filled 2021-06-09: qty 30, 30d supply, fill #0
  Filled 2021-07-07: qty 30, 30d supply, fill #1
  Filled 2021-08-09: qty 30, 30d supply, fill #2

## 2021-05-16 MED FILL — Semaglutide (Weight Mngmt) Soln Auto-Injector 2.4 MG/0.75ML: SUBCUTANEOUS | 28 days supply | Qty: 3 | Fill #1 | Status: CN

## 2021-05-16 NOTE — Assessment & Plan Note (Signed)
Potentially related to reflux, gastritis, or gastric ulcer.  Given that he is on wegovy we will check a lipase and liver enzymes.  We will start him on Nexium 20 mg once daily.  We will refer to GI.

## 2021-05-16 NOTE — Assessment & Plan Note (Signed)
Adequately controlled.  He will continue lisinopril/HCTZ 1 tablet daily.  Check CMP.

## 2021-05-16 NOTE — Assessment & Plan Note (Signed)
Refer to urology.  ?

## 2021-05-16 NOTE — Progress Notes (Signed)
Tommi Rumps, MD Phone: [phone]  Emit Kuenzel Rahrig is a 40 y.o. male who presents today for f/u.  HYPERTENSION Disease Monitoring Home BP Monitoring not checking Chest pain- no    Dyspnea- no Medications Compliance-  taking lisinopril/HCTZ.   Edema- no Just had another baby so has not been sleeping great. BMET    Component Value Date/Time   NA 139 07/19/2020 1004   K 4.3 07/19/2020 1004   CL 100 07/19/2020 1004   CO2 33 (H) 07/19/2020 1004   GLUCOSE 88 07/19/2020 1004   BUN 11 07/19/2020 1004   CREATININE 0.79 07/19/2020 1004   CREATININE 0.76 06/21/2018 1615   CALCIUM 9.4 07/19/2020 1004   Obesity: Patient is still on Wegovy.  He is not eating as much as he used to.  They were eating out some right after they got back from the hospital.  He has not been exercising much since he just had a second child.  Abdominal pain: He reports some epigastric abdominal discomfort.  He is had this previously and reports it was an ulcer though he has not had an EGD.  Feels like really bad gas in his epigastric region.  It comes and goes.  No nausea, vomiting, diarrhea, or blood in stool.  He saw a nurse practitioner at a different office and notes they advised him to take Pepcid 20 mg twice daily.  He notes it was helping initially though not as much recently.  No dysphagia.  Social History   Tobacco Use  Smoking Status Never  Smokeless Tobacco Former    Current Outpatient Medications on File Prior to Visit  Medication Sig Dispense Refill   albuterol (VENTOLIN HFA) 108 (90 Base) MCG/ACT inhaler Inhale 2 puffs into the lungs every 6 (six) hours as needed for wheezing or shortness of breath. 18 g 1   Blood Pressure Monitoring (BLOOD PRESSURE KIT) KIT Use once daily 1 kit 0   lisinopril-hydrochlorothiazide (ZESTORETIC) 20-12.5 MG tablet TAKE 1 TABLET BY MOUTH ONCE DAILY 90 tablet 0   Semaglutide-Weight Management (WEGOVY) 2.4 MG/0.75ML SOAJ INJECT 2.4 MG INTO THE SKIN ONCE A  WEEK. 3 mL 1   No current facility-administered medications on file prior to visit.     ROS see history of present illness  Objective  Physical Exam Vitals:   05/16/21 1538 05/16/21 1553  BP: 140/80 130/80  Pulse: 71   Temp: 98.2 F (36.8 C)   SpO2: 98%     BP Readings from Last 3 Encounters:  05/16/21 130/80  12/28/20 132/71  12/01/20 110/80   Wt Readings from Last 3 Encounters:  05/16/21 (!) 415 lb (188.2 kg)  12/01/20 (!) 429 lb 6.4 oz (194.8 kg)  08/31/20 (!) 442 lb 9.6 oz (200.8 kg)    Physical Exam Constitutional:      General: He is not in acute distress.    Appearance: He is not diaphoretic.  Cardiovascular:     Rate and Rhythm: Normal rate and regular rhythm.     Heart sounds: Normal heart sounds.  Pulmonary:     Effort: Pulmonary effort is normal.     Breath sounds: Normal breath sounds.  Abdominal:     General: Bowel sounds are normal. There is no distension.     Palpations: Abdomen is soft.     Tenderness: There is no abdominal tenderness.  Skin:    General: Skin is warm and dry.  Neurological:     Mental Status: He is alert.     Assessment/Plan:  Please see individual problem list.  Problem List Items Addressed This Visit     Epigastric abdominal pain - Primary    Potentially related to reflux, gastritis, or gastric ulcer.  Given that he is on wegovy we will check a lipase and liver enzymes.  We will start him on Nexium 20 mg once daily.  We will refer to GI.      Relevant Medications   esomeprazole (NEXIUM) 20 MG capsule   Other Relevant Orders   Lipase   Comp Met (CMET)   Ambulatory referral to Gastroenterology   Essential hypertension    Adequately controlled.  He will continue lisinopril/HCTZ 1 tablet daily.  Check CMP.      Morbid obesity ([place])    Encouraged healthy diet and exercise as he is able to with a new child in the house.  He can continue Wegovy unless his lab work reveals a pancreatic or gallbladder issue.       Vasectomy evaluation    Refer to urology.      Relevant Orders   Ambulatory referral to Urology    Return in about 6 months (around 11/14/2021) for HTN.  This visit occurred during the SARS-CoV-2 public health emergency.  Safety protocols were in place, including screening questions prior to the visit, additional usage of staff PPE, and extensive cleaning of exam room while observing appropriate contact time as indicated for disinfecting solutions.    Tommi Rumps, MD [place]

## 2021-05-16 NOTE — Patient Instructions (Signed)
Nice to see you. We will get lab work today and contact you with the results. GI should contact you to schedule an appointment. Urology should contact you regarding the vasectomy.

## 2021-05-16 NOTE — Assessment & Plan Note (Signed)
Encouraged healthy diet and exercise as he is able to with a new child in the house.  He can continue Wegovy unless his lab work reveals a pancreatic or gallbladder issue.

## 2021-05-17 ENCOUNTER — Encounter: Payer: Self-pay | Admitting: Family Medicine

## 2021-05-17 DIAGNOSIS — R748 Abnormal levels of other serum enzymes: Secondary | ICD-10-CM

## 2021-05-17 LAB — COMPREHENSIVE METABOLIC PANEL
ALT: 23 U/L (ref 0–53)
AST: 13 U/L (ref 0–37)
Albumin: 4.4 g/dL (ref 3.5–5.2)
Alkaline Phosphatase: 68 U/L (ref 39–117)
BUN: 13 mg/dL (ref 6–23)
CO2: 33 mEq/L — ABNORMAL HIGH (ref 19–32)
Calcium: 9.5 mg/dL (ref 8.4–10.5)
Chloride: 98 mEq/L (ref 96–112)
Creatinine, Ser: 0.86 mg/dL (ref 0.40–1.50)
GFR: 110.13 mL/min (ref >60.00)
Glucose, Bld: 83 mg/dL (ref 70–99)
Potassium: 4.2 mEq/L (ref 3.5–5.1)
Sodium: 138 mEq/L (ref 135–145)
Total Bilirubin: 0.4 mg/dL (ref 0.2–1.2)
Total Protein: 7.4 g/dL (ref 6.0–8.3)

## 2021-05-17 LAB — LIPASE: Lipase: 73 U/L — ABNORMAL HIGH (ref 11.0–59.0)

## 2021-05-19 NOTE — Telephone Encounter (Signed)
I called the patient and he is scheduled for labs in 2 weeks. Maryanna Stuber,cma

## 2021-05-24 NOTE — Progress Notes (Signed)
05/25/2021 3:04 PM   Arthur Oconnor Sep 17, 1980 [id number]  Referring provider: Leone [place], MD [address] [place],  [place] [address]  Chief Complaint  Patient presents with   VAS Consult    HPI: Arthur Oconnor is a 40 y.o. male  who presents today for further evaluation of possible vasectomy.  He denies a history of testicular trauma or pain.  No urinary issues.  No previous scrotal surgeries.  He has two children.   PMH: Past Medical History:  Diagnosis Date   Arthritis    Asthma    Chicken pox    Hay fever    Hypertension    Kidney stones     Surgical History: Past Surgical History:  Procedure Laterality Date   TONSILECTOMY, ADENOIDECTOMY, BILATERAL MYRINGOTOMY AND TUBES      Home Medications:  Allergies as of 05/25/2021   No Known Allergies      Medication List        Accurate as of May 25, 2021  3:04 PM. If you have any questions, ask your nurse or doctor.          albuterol 108 (90 Base) MCG/ACT inhaler Commonly known as: VENTOLIN HFA Inhale 2 puffs into the lungs every 6 (six) hours as needed for wheezing or shortness of breath.   Blood Pressure Kit Kit Use once daily   esomeprazole 20 MG capsule Commonly known as: NexIUM Take 1 capsule (20 mg total) by mouth daily before breakfast.   lisinopril-hydrochlorothiazide 20-12.5 MG tablet Commonly known as: ZESTORETIC TAKE 1 TABLET BY MOUTH ONCE DAILY   Wegovy 2.4 MG/0.75ML Soaj Generic drug: Semaglutide-Weight Management INJECT 2.4 MG INTO THE SKIN ONCE A WEEK.        Allergies: No Known Allergies  Family History: Family History  Problem Relation Age of Onset   Multiple myeloma Father    Arthritis Maternal Grandmother    Arthritis Paternal Grandmother     Social History:  reports that he has never smoked. He has quit using smokeless tobacco. He reports current alcohol use. He reports that he does not use drugs.   Physical  Exam: BP (!) 156/87    Pulse 74    Ht 6' 1"  (1.854 m)    Wt (!) 415 lb (188.2 kg)    BMI 54.75 kg/m   Constitutional:  Alert and oriented, No acute distress. HEENT: [place] AT, moist mucus membranes.  Trachea midline, no masses. Cardiovascular: No clubbing, cyanosis, or edema. Respiratory: Normal respiratory effort, no increased work of breathing. GI: Abdomen is soft, nontender, nondistended, no abdominal masses GU: Normal phallus.  Bilateral descended testicles without masses.  Vasa easily palpable bilaterally. Exam done in supine position to simulate an office base procedure and exam was compatible to being able to do procedure in office.  Skin: No rashes, bruises or suspicious lesions. Neurologic: Grossly intact, no focal deficits, moving all 4 extremities. Psychiatric: Normal mood and affect.   Assessment & Plan:   1. Vasectomy evaluation - Today, we discussed what the vas deferens is, where it is located, and its function. We reviewed the procedure for vasectomy, it's risks, benefits, alternatives, and likelihood of achieving his goals. We discussed in detail the procedure, complications, and recovery as well as the need for clearance prior to unprotected intercourse. We discussed that vasectomy does not protect against sexually transmitted diseases. We discussed that this procedure does not result in immediate sterility and that they would need to use other forms of  birth control until he has been cleared with negative postvasectomy semen analyses. I explained that the procedure is considered to be permanent and that attempts at reversal have varying degrees of success. These options include vasectomy reversal, sperm retrieval, and in vitro fertilization; these can be very expensive. We discussed the chance of postvasectomy pain syndrome which occurs in less than 5% of patients. I explained to the patient that there is no treatment to resolve this chronic pain, and that if it developed I would not  be able to help resolve the issue, but that surgery is generally not needed for correction. I explained there have even been reports of systemic like illness associated with this chronic pain, and that there was no good cure. I explained that vasectomy it is not a 100% reliable form of birth control, and the risk of pregnancy after vasectomy is approximately 1 in 2000 men who had a negative postvasectomy semen analysis or rare non-motile sperm. I explained that repeat vasectomy was necessary in less than 1% of vasectomy procedures when employing the type of technique that I use. I explained that he should refrain from ejaculation for approximately one week following vasectomy. I explained that there are other options for birth control which are permanent and non-permanent; we discussed these. I explained the rates of surgical complications, such as symptomatic hematoma or infection, are low (1-2%) and vary with the surgeon's experience and criteria used to diagnose the complication.   The patient had the opportunity to ask questions to his stated satisfaction. He voiced understanding of the above factors and stated that he has read all the information provided to him and the packets and informed consent.  He is interested in receiving of Valium 10 mg prior to the procedure for the purpose of anxiolysis.  A prescription was given today.  He will have a driver on the day of the procedure.  2. Morbid obesity  - Complicating factor  - Was able to palpate area when examined in supine position  Return for vasectomy   I,Kailey Littlejohn,acting as a scribe for Hollice Espy, MD.,have documented all relevant documentation on the behalf of Hollice Espy, MD,as directed by  Hollice Espy, MD while in the presence of Hollice Espy, MD.  I have reviewed the above documentation for accuracy and completeness, and I agree with the above.   Hollice Espy, MD   [facility] [address] [place], [place] [address] [phone]

## 2021-05-25 ENCOUNTER — Ambulatory Visit: Payer: 59 | Admitting: Urology

## 2021-05-25 ENCOUNTER — Other Ambulatory Visit: Payer: Self-pay

## 2021-05-25 ENCOUNTER — Encounter: Payer: Self-pay | Admitting: Urology

## 2021-05-25 VITALS — BP 156/87 | HR 74 | Ht 73.0 in | Wt >= 6400 oz

## 2021-05-25 DIAGNOSIS — Z3009 Encounter for other general counseling and advice on contraception: Secondary | ICD-10-CM

## 2021-05-25 NOTE — Patient Instructions (Signed)

## 2021-05-26 ENCOUNTER — Other Ambulatory Visit: Payer: Self-pay

## 2021-05-26 MED ORDER — DIAZEPAM 10 MG PO TABS
10.0000 mg | ORAL_TABLET | Freq: Once | ORAL | 0 refills | Status: AC
  Filled 2021-05-26 – 2021-06-03 (×2): qty 1, 1d supply, fill #0

## 2021-06-02 ENCOUNTER — Other Ambulatory Visit (INDEPENDENT_AMBULATORY_CARE_PROVIDER_SITE_OTHER): Payer: 59

## 2021-06-02 ENCOUNTER — Other Ambulatory Visit: Payer: Self-pay

## 2021-06-02 DIAGNOSIS — R748 Abnormal levels of other serum enzymes: Secondary | ICD-10-CM

## 2021-06-02 LAB — LIPASE: Lipase: 35 U/L (ref 11.0–59.0)

## 2021-06-03 ENCOUNTER — Other Ambulatory Visit: Payer: Self-pay

## 2021-06-06 ENCOUNTER — Other Ambulatory Visit: Payer: Self-pay

## 2021-06-09 ENCOUNTER — Other Ambulatory Visit: Payer: Self-pay

## 2021-06-09 MED ORDER — FAMOTIDINE 20 MG PO TABS
ORAL_TABLET | ORAL | 0 refills | Status: DC
  Filled 2021-06-09: qty 60, 30d supply, fill #0

## 2021-06-09 MED FILL — Lisinopril & Hydrochlorothiazide Tab 20-12.5 MG: ORAL | 30 days supply | Qty: 30 | Fill #0 | Status: AC

## 2021-06-13 ENCOUNTER — Other Ambulatory Visit: Payer: Self-pay

## 2021-06-13 NOTE — Progress Notes (Signed)
06/14/21  CC: No chief complaint on file.   HPI: Arthur Oconnor is a 41 y.o. male who presents today for a vasectomy.   He denies a history of testicular trauma or pain. No urinary issues.  No previous scrotal surgeries.  He has a personal history of morbid obesity.    He has two children.    Vitals:   06/14/21 1537  BP: (!) 151/84  Pulse: 92  NED. A&Ox3.   No respiratory distress   Abd soft, NT, ND Normal external genitalia with patent urethral meatus   Bilateral Vasectomy Procedure  Pre-Procedure: - Patient's scrotum was prepped and draped for vasectomy. - The vas was palpated through the scrotal skin on the left. - 1% Xylocaine was injected into the skin and surrounding tissue for placement  - In a similar manner, the vas on the right was identified, anesthetized, and stabilized.  Procedure: - A #11 blade was used to make a small stab incision in the skin overlying the vas - The left vas was isolated and brought up through the incision exposing that structure. - Bleeding points were cauterized as they occurred. - The vas was free from the surrounding structures and brought to the view. - A segment was positioned for placement with a hemostat. - A second hemostat was placed and a small segment between the two hemostats and was removed for inspection. - Each end of the transected vas lumen was fulgurated/ obliterated using needlepoint electrocautery -A fascial interposition was performed on testicular end of the vas using #3-0 chromic suture -The same procedure was performed on the right. - A single suture of #3-0 chromic catgut was used to close each lateral scrotal skin incision - A dressing was applied.  Post-Procedure: - Patient was instructed in care of the operative area - A specimen is to be delivered in 12 weeks   -Another form of contraception is to be used until post vasectomy semen analysis  Tawni Millers as a scribe for Vanna Scotland, MD.,have documented all relevant documentation on the behalf of Vanna Scotland, MD,as directed by  Vanna Scotland, MD while in the presence of Vanna Scotland, MD.  I have reviewed the above documentation for accuracy and completeness, and I agree with the above.   Vanna Scotland, MD

## 2021-06-14 ENCOUNTER — Other Ambulatory Visit: Payer: Self-pay

## 2021-06-14 ENCOUNTER — Encounter: Payer: Self-pay | Admitting: Urology

## 2021-06-14 ENCOUNTER — Ambulatory Visit: Payer: 59 | Admitting: Urology

## 2021-06-14 VITALS — BP 151/84 | HR 92 | Ht 73.0 in | Wt >= 6400 oz

## 2021-06-14 DIAGNOSIS — Z302 Encounter for sterilization: Secondary | ICD-10-CM | POA: Diagnosis not present

## 2021-06-14 DIAGNOSIS — Z3009 Encounter for other general counseling and advice on contraception: Secondary | ICD-10-CM

## 2021-06-14 NOTE — Patient Instructions (Signed)

## 2021-07-07 ENCOUNTER — Other Ambulatory Visit: Payer: Self-pay

## 2021-07-07 DIAGNOSIS — L03031 Cellulitis of right toe: Secondary | ICD-10-CM | POA: Diagnosis not present

## 2021-07-07 DIAGNOSIS — L600 Ingrowing nail: Secondary | ICD-10-CM | POA: Diagnosis not present

## 2021-07-07 DIAGNOSIS — B351 Tinea unguium: Secondary | ICD-10-CM | POA: Diagnosis not present

## 2021-07-07 MED FILL — Lisinopril & Hydrochlorothiazide Tab 20-12.5 MG: ORAL | 30 days supply | Qty: 30 | Fill #1 | Status: AC

## 2021-07-08 ENCOUNTER — Other Ambulatory Visit: Payer: Self-pay

## 2021-08-03 ENCOUNTER — Encounter: Payer: Self-pay | Admitting: Family Medicine

## 2021-08-09 ENCOUNTER — Other Ambulatory Visit: Payer: Self-pay

## 2021-08-09 MED FILL — Lisinopril & Hydrochlorothiazide Tab 20-12.5 MG: ORAL | 30 days supply | Qty: 30 | Fill #2 | Status: AC

## 2021-08-10 ENCOUNTER — Other Ambulatory Visit: Payer: Self-pay

## 2021-08-11 ENCOUNTER — Encounter: Payer: Self-pay | Admitting: Family Medicine

## 2021-09-07 ENCOUNTER — Other Ambulatory Visit: Payer: Self-pay | Admitting: Family Medicine

## 2021-09-07 ENCOUNTER — Other Ambulatory Visit: Payer: Self-pay

## 2021-09-07 DIAGNOSIS — R1013 Epigastric pain: Secondary | ICD-10-CM

## 2021-09-07 MED FILL — Lisinopril & Hydrochlorothiazide Tab 20-12.5 MG: ORAL | 30 days supply | Qty: 30 | Fill #0 | Status: AC

## 2021-09-07 MED FILL — Esomeprazole Magnesium Cap Delayed Release 20 MG (Base Eq): ORAL | 30 days supply | Qty: 30 | Fill #0 | Status: AC

## 2021-09-08 ENCOUNTER — Other Ambulatory Visit: Payer: Self-pay

## 2021-09-09 ENCOUNTER — Other Ambulatory Visit: Payer: Self-pay

## 2021-09-12 ENCOUNTER — Other Ambulatory Visit: Payer: Self-pay

## 2021-09-12 ENCOUNTER — Other Ambulatory Visit: Payer: 59

## 2021-09-12 DIAGNOSIS — Z3009 Encounter for other general counseling and advice on contraception: Secondary | ICD-10-CM

## 2021-09-13 ENCOUNTER — Telehealth: Payer: Self-pay | Admitting: *Deleted

## 2021-09-13 DIAGNOSIS — Z9852 Vasectomy status: Secondary | ICD-10-CM

## 2021-09-13 LAB — POST-VAS SPERM EVALUATION,QUAL: Volume: 3.1 mL

## 2021-09-13 NOTE — Telephone Encounter (Addendum)
Patient informed, scheduled lab appointment. Voice understanding. ? ? ?----- Message from Vanna Scotland, MD sent at 09/13/2021 11:52 AM EDT ----- ?There were still sperm present on the semen analysis.  Please continue to use alternative forms of birth control, ejaculate 10 more times and repeat the study in a month. ? ?Vanna Scotland, MD ? ?

## 2021-10-07 ENCOUNTER — Other Ambulatory Visit: Payer: Self-pay

## 2021-10-07 MED FILL — Lisinopril & Hydrochlorothiazide Tab 20-12.5 MG: ORAL | 30 days supply | Qty: 30 | Fill #1 | Status: AC

## 2021-10-07 MED FILL — Esomeprazole Magnesium Cap Delayed Release 20 MG (Base Eq): ORAL | 30 days supply | Qty: 30 | Fill #1 | Status: AC

## 2021-10-10 ENCOUNTER — Other Ambulatory Visit: Payer: Self-pay

## 2021-10-13 ENCOUNTER — Other Ambulatory Visit: Payer: 59

## 2021-10-14 ENCOUNTER — Other Ambulatory Visit: Payer: 59

## 2021-10-14 DIAGNOSIS — Z9852 Vasectomy status: Secondary | ICD-10-CM

## 2021-10-15 LAB — POST-VAS SPERM EVALUATION,QUAL: Volume: 2.5 mL

## 2021-10-18 ENCOUNTER — Encounter: Payer: Self-pay | Admitting: Urology

## 2021-10-19 ENCOUNTER — Telehealth: Payer: Self-pay | Admitting: *Deleted

## 2021-10-19 DIAGNOSIS — Z9852 Vasectomy status: Secondary | ICD-10-CM

## 2021-10-19 NOTE — Telephone Encounter (Signed)
-----   Message from Vanna Scotland, MD sent at 10/18/2021 11:58 AM EDT ----- ?There are still sperm in the semen analysis.  At this point, we generally offer a formal semen analysis to exactly count the number of motility of the sperm in order to confirm whether or not the semen analysis has failed.  Sometimes, there are very few immotile sperm and it still successful.  Unfortunately, this particular test only looks at presence or absence of sperm and not number or motility.  If he like to proceed with this, we can arrange for formal semen analysis to be dropped off at [facility] directly.  Please provide him with instructions and the order.  In the meantime, it is imperative that you continue to use backup forms of birth control.  You are not cleared at this point from a contraceptive perspective. ? ?Vanna Scotland, MD ? ?

## 2021-10-19 NOTE — Telephone Encounter (Signed)
Left Vm to return call 

## 2021-10-20 NOTE — Telephone Encounter (Signed)
Ok for another post vas semen analysis  Arthur Scotland, MD

## 2021-10-20 NOTE — Telephone Encounter (Signed)
Patient returned call, he would like to try to have more time to give a good specimen after "clearing the pipes" more times. OK to schedule a lab visit for another drop off or proceed with formal semen analysis?

## 2021-10-21 NOTE — Addendum Note (Signed)
Addended by: Milas Kocher A on: 10/21/2021 01:45 PM   Modules accepted: Orders

## 2021-10-21 NOTE — Telephone Encounter (Signed)
Spoke with patient, scheduled lab appointment. Voiced understanding.

## 2021-11-03 ENCOUNTER — Other Ambulatory Visit: Payer: Self-pay

## 2021-11-03 DIAGNOSIS — J0140 Acute pansinusitis, unspecified: Secondary | ICD-10-CM | POA: Diagnosis not present

## 2021-11-03 DIAGNOSIS — J301 Allergic rhinitis due to pollen: Secondary | ICD-10-CM | POA: Diagnosis not present

## 2021-11-03 MED FILL — Esomeprazole Magnesium Cap Delayed Release 20 MG (Base Eq): ORAL | 30 days supply | Qty: 30 | Fill #2 | Status: AC

## 2021-11-03 MED FILL — Lisinopril & Hydrochlorothiazide Tab 20-12.5 MG: ORAL | 30 days supply | Qty: 30 | Fill #2 | Status: AC

## 2021-11-04 ENCOUNTER — Other Ambulatory Visit: Payer: Self-pay

## 2021-11-04 MED ORDER — BUDESONIDE-FORMOTEROL FUMARATE 80-4.5 MCG/ACT IN AERO
INHALATION_SPRAY | RESPIRATORY_TRACT | 4 refills | Status: AC
  Filled 2021-11-04: qty 10.2, 30d supply, fill #0
  Filled 2022-05-01: qty 10.2, 30d supply, fill #1
  Filled 2022-09-04: qty 10.2, 30d supply, fill #2
  Filled 2022-10-05: qty 10.2, 30d supply, fill #3

## 2021-11-04 MED ORDER — FLUTICASONE PROPIONATE 50 MCG/ACT NA SUSP
NASAL | 0 refills | Status: AC
Start: 2021-11-03 — End: ?
  Filled 2021-11-04: qty 16, 30d supply, fill #0

## 2021-11-07 ENCOUNTER — Other Ambulatory Visit: Payer: Self-pay

## 2021-11-10 ENCOUNTER — Other Ambulatory Visit: Payer: Self-pay

## 2021-11-14 ENCOUNTER — Ambulatory Visit: Payer: 59 | Admitting: Family Medicine

## 2021-11-14 ENCOUNTER — Other Ambulatory Visit: Payer: Self-pay

## 2021-11-14 ENCOUNTER — Other Ambulatory Visit: Payer: 59

## 2021-11-14 DIAGNOSIS — Z9852 Vasectomy status: Secondary | ICD-10-CM

## 2021-11-15 LAB — POST-VAS SPERM EVALUATION,QUAL: Volume: 2.8 mL

## 2021-12-02 ENCOUNTER — Other Ambulatory Visit: Payer: Self-pay

## 2021-12-02 ENCOUNTER — Other Ambulatory Visit: Payer: Self-pay | Admitting: Family Medicine

## 2021-12-02 MED ORDER — LISINOPRIL-HYDROCHLOROTHIAZIDE 20-12.5 MG PO TABS
ORAL_TABLET | ORAL | 0 refills | Status: DC
  Filled 2021-12-02: qty 30, 30d supply, fill #0

## 2021-12-02 MED FILL — Esomeprazole Magnesium Cap Delayed Release 20 MG (Base Eq): ORAL | 30 days supply | Qty: 30 | Fill #3 | Status: AC

## 2021-12-05 ENCOUNTER — Other Ambulatory Visit: Payer: Self-pay

## 2022-01-01 ENCOUNTER — Other Ambulatory Visit: Payer: Self-pay | Admitting: Family Medicine

## 2022-01-01 DIAGNOSIS — R1013 Epigastric pain: Secondary | ICD-10-CM

## 2022-01-01 MED ORDER — LISINOPRIL-HYDROCHLOROTHIAZIDE 20-12.5 MG PO TABS
ORAL_TABLET | ORAL | 0 refills | Status: DC
  Filled 2022-01-01: qty 30, 30d supply, fill #0

## 2022-01-01 MED ORDER — ESOMEPRAZOLE MAGNESIUM 20 MG PO CPDR
20.0000 mg | DELAYED_RELEASE_CAPSULE | Freq: Every day | ORAL | 3 refills | Status: DC
  Filled 2022-01-01: qty 30, 30d supply, fill #0

## 2022-01-02 ENCOUNTER — Other Ambulatory Visit: Payer: Self-pay

## 2022-01-03 ENCOUNTER — Other Ambulatory Visit: Payer: Self-pay

## 2022-01-11 DIAGNOSIS — H02844 Edema of left upper eyelid: Secondary | ICD-10-CM | POA: Diagnosis not present

## 2022-01-13 ENCOUNTER — Encounter: Payer: Self-pay | Admitting: Family Medicine

## 2022-01-13 ENCOUNTER — Other Ambulatory Visit: Payer: Self-pay

## 2022-01-13 ENCOUNTER — Ambulatory Visit: Payer: 59 | Admitting: Family Medicine

## 2022-01-13 DIAGNOSIS — R1013 Epigastric pain: Secondary | ICD-10-CM

## 2022-01-13 DIAGNOSIS — G5603 Carpal tunnel syndrome, bilateral upper limbs: Secondary | ICD-10-CM

## 2022-01-13 DIAGNOSIS — I8392 Asymptomatic varicose veins of left lower extremity: Secondary | ICD-10-CM | POA: Diagnosis not present

## 2022-01-13 DIAGNOSIS — K219 Gastro-esophageal reflux disease without esophagitis: Secondary | ICD-10-CM | POA: Diagnosis not present

## 2022-01-13 DIAGNOSIS — I10 Essential (primary) hypertension: Secondary | ICD-10-CM | POA: Diagnosis not present

## 2022-01-13 DIAGNOSIS — J4520 Mild intermittent asthma, uncomplicated: Secondary | ICD-10-CM

## 2022-01-13 DIAGNOSIS — G5600 Carpal tunnel syndrome, unspecified upper limb: Secondary | ICD-10-CM | POA: Insufficient documentation

## 2022-01-13 MED ORDER — ESOMEPRAZOLE MAGNESIUM 20 MG PO CPDR
20.0000 mg | DELAYED_RELEASE_CAPSULE | Freq: Every day | ORAL | 3 refills | Status: DC
  Filled 2022-01-13 – 2022-02-07 (×2): qty 30, 30d supply, fill #0
  Filled 2022-03-04: qty 30, 30d supply, fill #1
  Filled 2022-04-05: qty 30, 30d supply, fill #2
  Filled 2022-05-01: qty 30, 30d supply, fill #3
  Filled 2022-05-22 – 2022-06-06 (×2): qty 30, 30d supply, fill #4
  Filled 2022-07-03: qty 30, 30d supply, fill #5

## 2022-01-13 MED ORDER — LISINOPRIL-HYDROCHLOROTHIAZIDE 20-12.5 MG PO TABS
ORAL_TABLET | ORAL | 3 refills | Status: DC
  Filled 2022-01-13 – 2022-02-07 (×2): qty 30, 30d supply, fill #0
  Filled 2022-03-04: qty 30, 30d supply, fill #1
  Filled 2022-04-05: qty 30, 30d supply, fill #2
  Filled 2022-05-01: qty 30, 30d supply, fill #3
  Filled 2022-05-22 – 2022-06-06 (×2): qty 30, 30d supply, fill #4
  Filled 2022-07-03: qty 30, 30d supply, fill #5
  Filled 2022-07-31: qty 30, 30d supply, fill #6
  Filled 2022-09-04: qty 30, 30d supply, fill #7
  Filled 2022-10-05: qty 30, 30d supply, fill #8
  Filled 2022-10-31: qty 30, 30d supply, fill #9
  Filled 2022-11-28: qty 30, 30d supply, fill #10
  Filled 2023-01-02: qty 30, 30d supply, fill #11

## 2022-01-13 NOTE — Assessment & Plan Note (Signed)
Borderline elevated.  He will continue lisinopril-HCTZ 1 tablet daily.  We will check a BMP.  He will return in 3 months for his blood pressure.  He will try to start checking his blood pressure at home and if its not consistently less than 140/90 he will let us know.

## 2022-01-13 NOTE — Patient Instructions (Signed)
Nice to see you. Please try to start checking your blood pressure several times a week.  Your goal is less than 140/90.

## 2022-01-13 NOTE — Progress Notes (Signed)
Arthur Rumps, MD Phone: [phone]  Arthur Oconnor is a 41 y.o. male who presents today for f/u.  HYPERTENSION Disease Monitoring Home BP Monitoring not checking at home Chest pain- no    Dyspnea- no Medications Compliance-  taking lisinopril/HCTZ.  Edema- no BMET    Component Value Date/Time   NA 138 05/16/2021 1558   K 4.2 05/16/2021 1558   CL 98 05/16/2021 1558   CO2 33 (H) 05/16/2021 1558   GLUCOSE 83 05/16/2021 1558   BUN 13 05/16/2021 1558   CREATININE 0.86 05/16/2021 1558   CREATININE 0.76 06/21/2018 1615   CALCIUM 9.5 05/16/2021 1558   GERD:   Reflux symptoms: rare   Abd pain: no   Blood in stool: no  Dysphagia: no   EGD: no  Medication: nexium  Asthma: Patient is asymptomatic.  He does not use any medications for this.  Varicose veins: Patient notes some new varicose veins in his left leg.  He has rare swelling in his legs.  No pain associated with varicose veins.  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  He will wear some braces on his wrists and that will help resolve the issue.  He notes he also switched pillows and that helped some of the numbness resolved completely.   Social History   Tobacco Use  Smoking Status Never  Smokeless Tobacco Former    Current Outpatient Medications on File Prior to Visit  Medication Sig Dispense Refill   albuterol (VENTOLIN HFA) 108 (90 Base) MCG/ACT inhaler Inhale 2 puffs into the lungs every 6 (six) hours as needed for wheezing or shortness of breath. 18 g 1   Blood Pressure Monitoring (BLOOD PRESSURE KIT) KIT Use once daily 1 kit 0   budesonide-formoterol (SYMBICORT) 80-4.5 MCG/ACT inhaler Inhale 2 puffs by mouth twice a day. Rinse mouth with water after each use 10.2 g 4   fluticasone (FLONASE) 50 MCG/ACT nasal spray Use one spray in each nostril once daily 16 g 0    No current facility-administered medications on file prior to visit.     ROS see history of present illness  Objective  Physical Exam Vitals:   01/13/22 1446  BP: (!) 140/80  Pulse: 83  Temp: 98.7 F (37.1 C)  SpO2: 98%    BP Readings from Last 3 Encounters:  01/13/22 (!) 140/80  06/14/21 (!) 151/84  05/25/21 (!) 156/87   Wt Readings from Last 3 Encounters:  01/13/22 (!) 429 lb 12.8 oz (195 kg)  06/14/21 (!) 415 lb (188.2 kg)  05/25/21 (!) 415 lb (188.2 kg)    Physical Exam Constitutional:      General: He is not in acute distress.    Appearance: He is not diaphoretic.  Cardiovascular:     Rate and Rhythm: Normal rate and regular rhythm.     Heart sounds: Normal heart sounds.  Pulmonary:     Effort: Pulmonary effort is normal.     Breath sounds: Normal breath sounds.  Skin:    General: Skin is warm and dry.  Neurological:     Mental Status: He is alert.     Comments: 5/5 strength bilateral biceps, triceps, and grip, sensation to light touch intact bilateral upper extremities, negative Tinel's bilateral wrists     Assessment/Plan: Please see individual problem list.  Problem List Items Addressed This Visit  Asthma (Chronic)    Asymptomatic.  He will monitor for any recurrent symptoms.      Essential hypertension (Chronic)    Borderline elevated.  He will continue lisinopril-HCTZ 1 tablet daily.  We will check a BMP.  He will return in 3 months for his blood pressure.  He will try to start checking his blood pressure at home and if its not consistently less than 140/90 he will let us know.      Relevant Medications   lisinopril-hydrochlorothiazide (ZESTORETIC) 20-12.5 MG tablet   Other Relevant Orders   Basic Metabolic Panel (BMET)   GERD (gastroesophageal reflux disease) (Chronic)    Well-controlled.  Continue Nexium 20 mg daily.      Relevant Medications   esomeprazole (NEXIUM) 20 MG capsule   Carpal tunnel syndrome    Continue as needed  use of cock-up splints.  If it worsens or progresses he will let us know.      Varicose veins of left lower extremity    Asymptomatic.  Discussed monitoring these and if he develops symptoms or worsening varicose veins he will let us know.      Relevant Medications   lisinopril-hydrochlorothiazide (ZESTORETIC) 20-12.5 MG tablet   Other Visit Diagnoses     Epigastric abdominal pain       Relevant Medications   esomeprazole (NEXIUM) 20 MG capsule        Return in about 3 months (around 04/15/2022) for Blood pressure follow-up.   Arthur Rumps, MD [place]

## 2022-01-13 NOTE — Assessment & Plan Note (Signed)
Asymptomatic.  He will monitor for any recurrent symptoms. 

## 2022-01-13 NOTE — Assessment & Plan Note (Signed)
Asymptomatic.  Discussed monitoring these and if he develops symptoms or worsening varicose veins he will let us know.

## 2022-01-13 NOTE — Assessment & Plan Note (Signed)
Well-controlled.  Continue Nexium 20 mg daily.

## 2022-01-13 NOTE — Assessment & Plan Note (Signed)
Continue as needed use of cock-up splints.  If it worsens or progresses he will let us know.

## 2022-01-14 LAB — BASIC METABOLIC PANEL
BUN: 14 mg/dL (ref 7–25)
CO2: 29 mmol/L (ref 20–32)
Calcium: 9.4 mg/dL (ref 8.6–10.3)
Chloride: 100 mmol/L (ref 98–110)
Creat: 0.94 mg/dL (ref 0.60–1.26)
Glucose, Bld: 90 mg/dL (ref 65–99)
Potassium: 3.7 mmol/L (ref 3.5–5.3)
Sodium: 139 mmol/L (ref 135–146)

## 2022-02-07 ENCOUNTER — Other Ambulatory Visit: Payer: Self-pay

## 2022-03-06 ENCOUNTER — Other Ambulatory Visit: Payer: Self-pay

## 2022-04-05 ENCOUNTER — Other Ambulatory Visit: Payer: Self-pay

## 2022-04-18 ENCOUNTER — Encounter: Payer: 59 | Admitting: Family Medicine

## 2022-05-01 ENCOUNTER — Other Ambulatory Visit: Payer: Self-pay

## 2022-05-02 ENCOUNTER — Other Ambulatory Visit: Payer: Self-pay

## 2022-05-22 ENCOUNTER — Other Ambulatory Visit: Payer: Self-pay

## 2022-05-25 ENCOUNTER — Other Ambulatory Visit: Payer: Self-pay

## 2022-06-04 ENCOUNTER — Other Ambulatory Visit: Payer: Self-pay

## 2022-06-06 ENCOUNTER — Other Ambulatory Visit: Payer: Self-pay

## 2022-06-06 ENCOUNTER — Encounter: Payer: Self-pay | Admitting: Family Medicine

## 2022-06-06 ENCOUNTER — Ambulatory Visit (INDEPENDENT_AMBULATORY_CARE_PROVIDER_SITE_OTHER): Payer: 59 | Admitting: Family Medicine

## 2022-06-06 ENCOUNTER — Other Ambulatory Visit: Payer: Self-pay | Admitting: Family Medicine

## 2022-06-06 VITALS — BP 130/70 | HR 68 | Temp 98.3°F | Ht 73.0 in | Wt >= 6400 oz

## 2022-06-06 DIAGNOSIS — Z0001 Encounter for general adult medical examination with abnormal findings: Secondary | ICD-10-CM | POA: Diagnosis not present

## 2022-06-06 DIAGNOSIS — S61219A Laceration without foreign body of unspecified finger without damage to nail, initial encounter: Secondary | ICD-10-CM | POA: Insufficient documentation

## 2022-06-06 DIAGNOSIS — S61213A Laceration without foreign body of left middle finger without damage to nail, initial encounter: Secondary | ICD-10-CM

## 2022-06-06 DIAGNOSIS — Z13220 Encounter for screening for lipoid disorders: Secondary | ICD-10-CM

## 2022-06-06 DIAGNOSIS — I10 Essential (primary) hypertension: Secondary | ICD-10-CM | POA: Diagnosis not present

## 2022-06-06 LAB — COMPREHENSIVE METABOLIC PANEL
ALT: 15 U/L (ref 0–53)
AST: 14 U/L (ref 0–37)
Albumin: 4.3 g/dL (ref 3.5–5.2)
Alkaline Phosphatase: 58 U/L (ref 39–117)
BUN: 11 mg/dL (ref 6–23)
CO2: 31 mEq/L (ref 19–32)
Calcium: 9.2 mg/dL (ref 8.4–10.5)
Chloride: 98 mEq/L (ref 96–112)
Creatinine, Ser: 0.77 mg/dL (ref 0.40–1.50)
GFR: 113.03 mL/min (ref >60.00)
Glucose, Bld: 96 mg/dL (ref 70–99)
Potassium: 4 mEq/L (ref 3.5–5.1)
Sodium: 139 mEq/L (ref 135–145)
Total Bilirubin: 0.7 mg/dL (ref 0.2–1.2)
Total Protein: 7.1 g/dL (ref 6.0–8.3)

## 2022-06-06 LAB — LIPID PANEL
Cholesterol: 158 mg/dL (ref 0–200)
HDL: 52.9 mg/dL (ref >39.00)
LDL Cholesterol: 81 mg/dL (ref 0–99)
NonHDL: 104.99
Total CHOL/HDL Ratio: 3
Triglycerides: 121 mg/dL (ref 0.0–149.0)
VLDL: 24.2 mg/dL (ref 0.0–40.0)

## 2022-06-06 LAB — HEMOGLOBIN A1C: Hgb A1c MFr Bld: 5.5 % (ref 4.6–6.5)

## 2022-06-06 NOTE — Progress Notes (Signed)
Tommi Rumps, MD Phone: [phone]  Arthur Oconnor is a 42 y.o. male who presents today for CPE.  Diet: lots of chicken, not as many leafy greens as he should, not much beef, no soda or sweet tea Exercise: active through work Colonoscopy: not indicated Prostate cancer screening: not indicated Family history-  Prostate cancer: no  Colon cancer: maternal grandfather related to lead exposure Vaccines-   Flu: declines   Tetanus: UTD  COVID19: x3 HIV screening: declines Hep C Screening: declines Tobacco use: rare cigar Alcohol use: rare Illicit Drug use: no Dentist: yes Ophthalmology: yes  Cut: Patient notes he cut his left distal middle finger on glass that broke yesterday.  He notes there is no retained foreign body.  He notes he cleansed this area and has no associated pain.   Active Ambulatory Problems    Diagnosis Date Noted   Essential hypertension 03/28/2016   Asthma 03/20/2017   Morbid obesity ([place]) 03/20/2017   GERD (gastroesophageal reflux disease) 02/27/2020   Carpal tunnel syndrome 01/13/2022   Varicose veins of left lower extremity 01/13/2022   Encounter for general adult medical examination with abnormal findings 06/06/2022   Laceration of finger 06/06/2022   Resolved Ambulatory Problems    Diagnosis Date Noted   Epigastric abdominal pain 05/28/2019   Low back pain 12/02/2020   Abnormal urinalysis 12/02/2020   Vasectomy evaluation 05/16/2021   Past Medical History:  Diagnosis Date   Arthritis    Chicken pox    Hay fever    Hypertension    Kidney stones     Family History  Problem Relation Age of Onset   Multiple myeloma Father    Arthritis Maternal Grandmother    Arthritis Paternal Grandmother     Social History   Socioeconomic History   Marital status: Married    Spouse name: Not on file   Number of children: Not on file   Years of education: Not on file   Highest education level: Not on file  Occupational History   Not  on file  Tobacco Use   Smoking status: Never   Smokeless tobacco: Former  Substance and Sexual Activity   Alcohol use: Yes    Comment: once a month    Drug use: No   Sexual activity: Not on file  Other Topics Concern   Not on file  Social History Narrative   Works as a Music therapist.    Social Determinants of Health   Financial Resource Strain: Not on file  Food Insecurity: Not on file  Transportation Needs: Not on file  Physical Activity: Not on file  Stress: Not on file  Social Connections: Not on file  Intimate Partner Violence: Not on file    ROS  General:  Negative for nexplained weight loss, fever Skin: Negative for new or changing mole, sore that won't heal HEENT: Negative for trouble hearing, trouble seeing, ringing in ears, mouth sores, hoarseness, change in voice, dysphagia. CV:  Negative for chest pain, dyspnea, edema, palpitations Resp: Negative for cough, dyspnea, hemoptysis GI: Negative for nausea, vomiting, diarrhea, constipation, abdominal pain, melena, hematochezia. GU: Negative for dysuria, incontinence, urinary hesitance, hematuria, vaginal or penile discharge, polyuria, sexual difficulty, lumps in testicle or breasts MSK: Negative for muscle cramps or aches, joint pain or swelling Neuro: Negative for headaches, weakness, numbness, dizziness, passing out/fainting Psych: Negative for depression, anxiety, memory problems  Objective  Physical Exam Vitals:   06/06/22 1009  BP: 130/70  Pulse: 68  Temp: 98.3  F (36.8 C)  SpO2: 97%    BP Readings from Last 3 Encounters:  06/06/22 130/70  01/13/22 (!) 140/80  06/14/21 (!) 151/84   Wt Readings from Last 3 Encounters:  06/06/22 (!) 436 lb 6.4 oz (197.9 kg)  01/13/22 (!) 429 lb 12.8 oz (195 kg)  06/14/21 (!) 415 lb (188.2 kg)    Physical Exam Constitutional:      General: He is not in acute distress.    Appearance: He is not diaphoretic.  HENT:     Head: Normocephalic and atraumatic.   Cardiovascular:     Rate and Rhythm: Normal rate and regular rhythm.     Heart sounds: Normal heart sounds.  Pulmonary:     Effort: Pulmonary effort is normal.     Breath sounds: Normal breath sounds.  Abdominal:     General: Bowel sounds are normal. There is no distension.     Palpations: Abdomen is soft.     Tenderness: There is no abdominal tenderness.  Musculoskeletal:     Right lower leg: No edema.     Left lower leg: No edema.  Lymphadenopathy:     Cervical: No cervical adenopathy.  Skin:    General: Skin is warm and dry.  Neurological:     Mental Status: He is alert.  Psychiatric:        Mood and Affect: Mood normal.     No apparent retained foreign body, area was cleansed with an alcohol swab, sensation is intact and has good capillary refill in this finger, nontender  Assessment/Plan:   Encounter for general adult medical examination with abnormal findings Assessment & Plan: Physical exam completed.  I encouraged continued generally healthy diet.  Discussed adding in additional green vegetables.  Discussed adding an additional exercise.  Patient Clines flu and further COVID vaccinations.  He declines hepatitis C and HIV screening as he is low risk.  Lab work as outlined.   Laceration of left middle finger without foreign body without damage to nail, initial encounter Assessment & Plan: Discussed he is outside of the window for sutures.  Advised to monitor for signs of infection and keep the area cleansed with soap and water.  If he develops redness, drainage, swelling, pain, or changes in sensation he will seek medical attention immediately.  Discussed in the future if he has a laceration that involves the nailbed he needs to be evaluated.  This laceration does not involve the nailbed.   Morbid obesity ([place]) -     Hemoglobin A1c  Essential hypertension -     Comprehensive metabolic panel  Lipid screening -     Comprehensive metabolic panel -     Lipid  panel    Return in about 6 months (around 12/05/2022) for Hypertension.   Tommi Rumps, MD [place]

## 2022-06-06 NOTE — Assessment & Plan Note (Signed)
Physical exam completed.  I encouraged continued generally healthy diet.  Discussed adding in additional green vegetables.  Discussed adding an additional exercise.  Patient Clines flu and further COVID vaccinations.  He declines hepatitis C and HIV screening as he is low risk.  Lab work as outlined.

## 2022-06-06 NOTE — Assessment & Plan Note (Signed)
Discussed he is outside of the window for sutures.  Advised to monitor for signs of infection and keep the area cleansed with soap and water.  If he develops redness, drainage, swelling, pain, or changes in sensation he will seek medical attention immediately.  Discussed in the future if he has a laceration that involves the nailbed he needs to be evaluated.  This laceration does not involve the nailbed.

## 2022-08-31 DIAGNOSIS — L600 Ingrowing nail: Secondary | ICD-10-CM | POA: Diagnosis not present

## 2022-08-31 DIAGNOSIS — B351 Tinea unguium: Secondary | ICD-10-CM | POA: Diagnosis not present

## 2022-08-31 DIAGNOSIS — L309 Dermatitis, unspecified: Secondary | ICD-10-CM | POA: Diagnosis not present

## 2022-12-08 ENCOUNTER — Ambulatory Visit: Payer: 59 | Admitting: Family Medicine

## 2022-12-29 DIAGNOSIS — L600 Ingrowing nail: Secondary | ICD-10-CM | POA: Diagnosis not present

## 2022-12-29 DIAGNOSIS — B351 Tinea unguium: Secondary | ICD-10-CM | POA: Diagnosis not present

## 2022-12-29 DIAGNOSIS — L03031 Cellulitis of right toe: Secondary | ICD-10-CM | POA: Diagnosis not present

## 2023-01-03 ENCOUNTER — Encounter (INDEPENDENT_AMBULATORY_CARE_PROVIDER_SITE_OTHER): Payer: Self-pay

## 2023-01-04 ENCOUNTER — Other Ambulatory Visit: Payer: Self-pay

## 2023-01-19 ENCOUNTER — Ambulatory Visit: Payer: 59 | Admitting: Family Medicine

## 2023-01-19 ENCOUNTER — Encounter: Payer: Self-pay | Admitting: Family Medicine

## 2023-01-19 ENCOUNTER — Other Ambulatory Visit: Payer: Self-pay

## 2023-01-19 VITALS — BP 128/80 | HR 74 | Temp 98.2°F | Ht 73.0 in | Wt >= 6400 oz

## 2023-01-19 DIAGNOSIS — I10 Essential (primary) hypertension: Secondary | ICD-10-CM | POA: Diagnosis not present

## 2023-01-19 MED ORDER — LISINOPRIL-HYDROCHLOROTHIAZIDE 20-12.5 MG PO TABS
1.0000 | ORAL_TABLET | Freq: Every day | ORAL | 3 refills | Status: DC
  Filled 2023-01-19: qty 30, 30d supply, fill #0
  Filled 2023-02-26: qty 30, 30d supply, fill #1
  Filled 2023-03-25: qty 30, 30d supply, fill #2
  Filled 2023-04-30: qty 30, 30d supply, fill #3
  Filled 2023-05-26: qty 30, 30d supply, fill #4
  Filled 2023-07-02: qty 30, 30d supply, fill #5
  Filled 2023-07-30: qty 30, 30d supply, fill #6
  Filled 2023-08-28: qty 30, 30d supply, fill #7
  Filled 2023-09-23: qty 30, 30d supply, fill #8
  Filled 2023-10-29: qty 30, 30d supply, fill #9
  Filled 2023-11-26: qty 30, 30d supply, fill #10
  Filled 2023-12-24: qty 30, 30d supply, fill #11

## 2023-01-19 NOTE — Progress Notes (Signed)
  Marikay Alar, MD Phone: [phone]  Arthur Oconnor is a 42 y.o. male who presents today for f/u.  HYPERTENSION Disease Monitoring Home BP Monitoring 126/82 Chest pain- no    Dyspnea- no Medications Compliance-  taking lisinopril/HCTZ.   Edema- no chronic issue. BMET    Component Value Date/Time   NA 139 06/06/2022 1028   K 4.0 06/06/2022 1028   CL 98 06/06/2022 1028   CO2 31 06/06/2022 1028   GLUCOSE 96 06/06/2022 1028   BUN 11 06/06/2022 1028   CREATININE 0.77 06/06/2022 1028   CREATININE 0.94 01/13/2022 1505   CALCIUM 9.2 06/06/2022 1028     Social History   Tobacco Use  Smoking Status Never  Smokeless Tobacco Former    Current Outpatient Medications on File Prior to Visit  Medication Sig Dispense Refill   budesonide-formoterol (SYMBICORT) 80-4.5 MCG/ACT inhaler Inhale 2 puffs by mouth twice a day. Rinse mouth with water after each use 10.2 g 4   fluticasone (FLONASE) 50 MCG/ACT nasal spray Use one spray in each nostril once daily 16 g 0   No current facility-administered medications on file prior to visit.     ROS see history of present illness  Objective  Physical Exam Vitals:   01/19/23 1445  BP: 128/80  Pulse: 74  Temp: 98.2 F (36.8 C)  SpO2: 99%    BP Readings from Last 3 Encounters:  01/19/23 128/80  06/06/22 130/70  01/13/22 (!) 140/80   Wt Readings from Last 3 Encounters:  01/19/23 (!) 437 lb (198.2 kg)  06/06/22 (!) 436 lb 6.4 oz (197.9 kg)  01/13/22 (!) 429 lb 12.8 oz (195 kg)    Physical Exam Constitutional:      General: He is not in acute distress.    Appearance: He is not diaphoretic.  Cardiovascular:     Rate and Rhythm: Normal rate and regular rhythm.     Heart sounds: Normal heart sounds.  Pulmonary:     Effort: Pulmonary effort is normal.     Breath sounds: Normal breath sounds.  Skin:    General: Skin is warm and dry.  Neurological:     Mental Status: He is alert.      Assessment/Plan: Please see  individual problem list.  Essential hypertension Assessment & Plan: Well controlled.  Patient will continue lisinopril-HCTZ 1 tablet daily.  Check lab work when he returns from vacation.  Follow-up in 6 months for physical.  Orders: -     Basic metabolic panel -     Lisinopril-hydroCHLOROthiazide; TAKE 1 TABLET BY MOUTH ONCE DAILY  Dispense: 90 tablet; Refill: 3     Return in about 2 weeks (around 02/02/2023) for labs, 6 months CPE.   Marikay Alar, MD [facility]

## 2023-01-19 NOTE — Assessment & Plan Note (Signed)
Well controlled.  Patient will continue lisinopril-HCTZ 1 tablet daily.  Check lab work when he returns from vacation.  Follow-up in 6 months for physical.

## 2023-01-19 NOTE — Addendum Note (Signed)
Addended by: Kristie Cowman on: 01/19/2023 03:46 PM   Modules accepted: Orders

## 2023-01-23 ENCOUNTER — Other Ambulatory Visit: Payer: Self-pay

## 2023-01-26 ENCOUNTER — Other Ambulatory Visit: Payer: Self-pay

## 2023-02-09 ENCOUNTER — Other Ambulatory Visit: Payer: 59

## 2023-02-12 ENCOUNTER — Other Ambulatory Visit (INDEPENDENT_AMBULATORY_CARE_PROVIDER_SITE_OTHER): Payer: 59

## 2023-02-12 DIAGNOSIS — I10 Essential (primary) hypertension: Secondary | ICD-10-CM | POA: Diagnosis not present

## 2023-02-13 LAB — BASIC METABOLIC PANEL
BUN: 14 mg/dL (ref 6–23)
CO2: 31 meq/L (ref 19–32)
Calcium: 9.5 mg/dL (ref 8.4–10.5)
Chloride: 99 meq/L (ref 96–112)
Creatinine, Ser: 0.8 mg/dL (ref 0.40–1.50)
GFR: 111.19 mL/min (ref >60.00)
Glucose, Bld: 91 mg/dL (ref 70–99)
Potassium: 4.2 meq/L (ref 3.5–5.1)
Sodium: 138 meq/L (ref 135–145)

## 2023-02-18 ENCOUNTER — Encounter: Payer: Self-pay | Admitting: Family Medicine

## 2023-02-18 DIAGNOSIS — S93402A Sprain of unspecified ligament of left ankle, initial encounter: Secondary | ICD-10-CM | POA: Diagnosis not present

## 2023-02-20 IMAGING — CR DG LUMBAR SPINE COMPLETE 4+V
5 series · 5 of 5 positions shown · non-contrast
Comparison: None.

CLINICAL DATA: Acute low back pain for 3 months.

EXAM:
LUMBAR SPINE - COMPLETE 4+ VIEW

[l-spine ap]
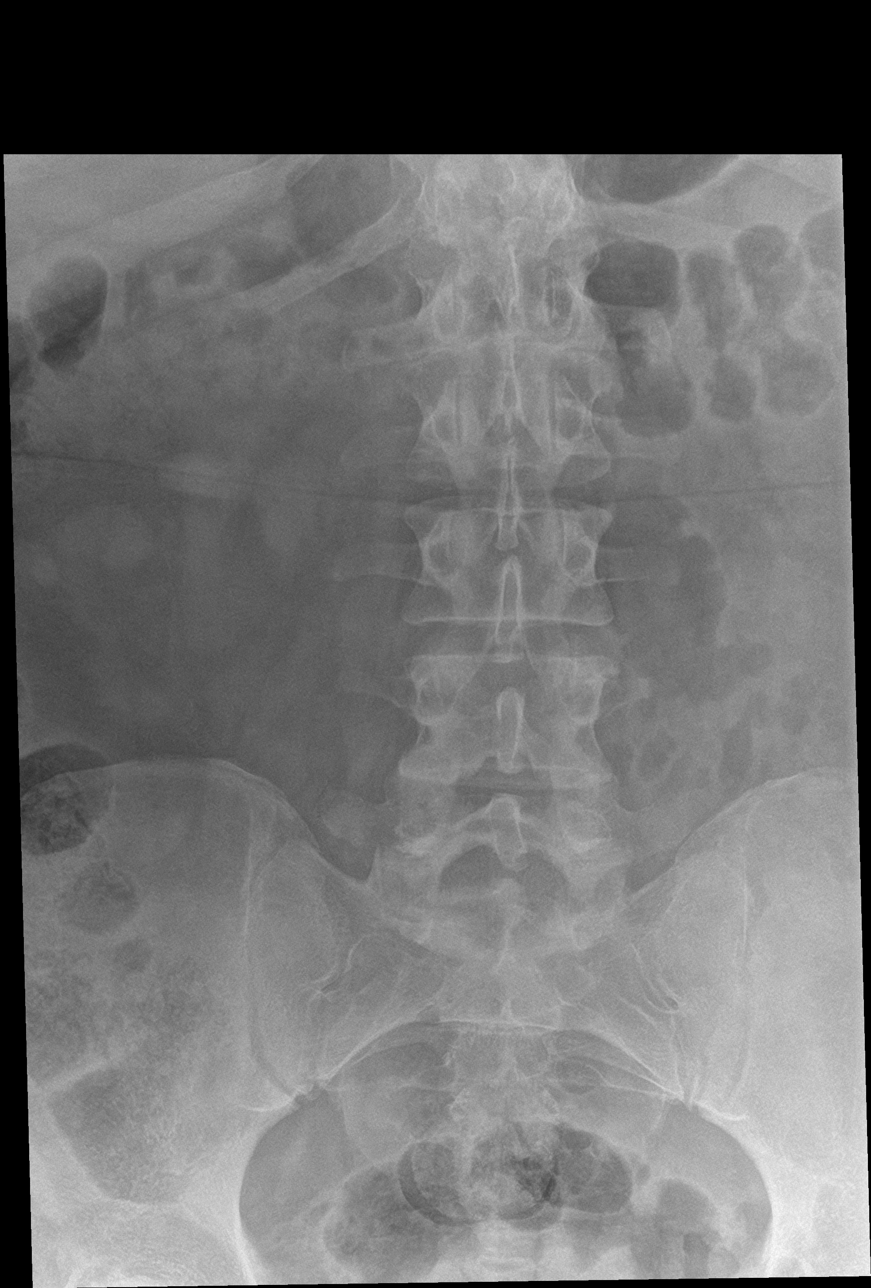

[l-spine obl (1 of 2)]
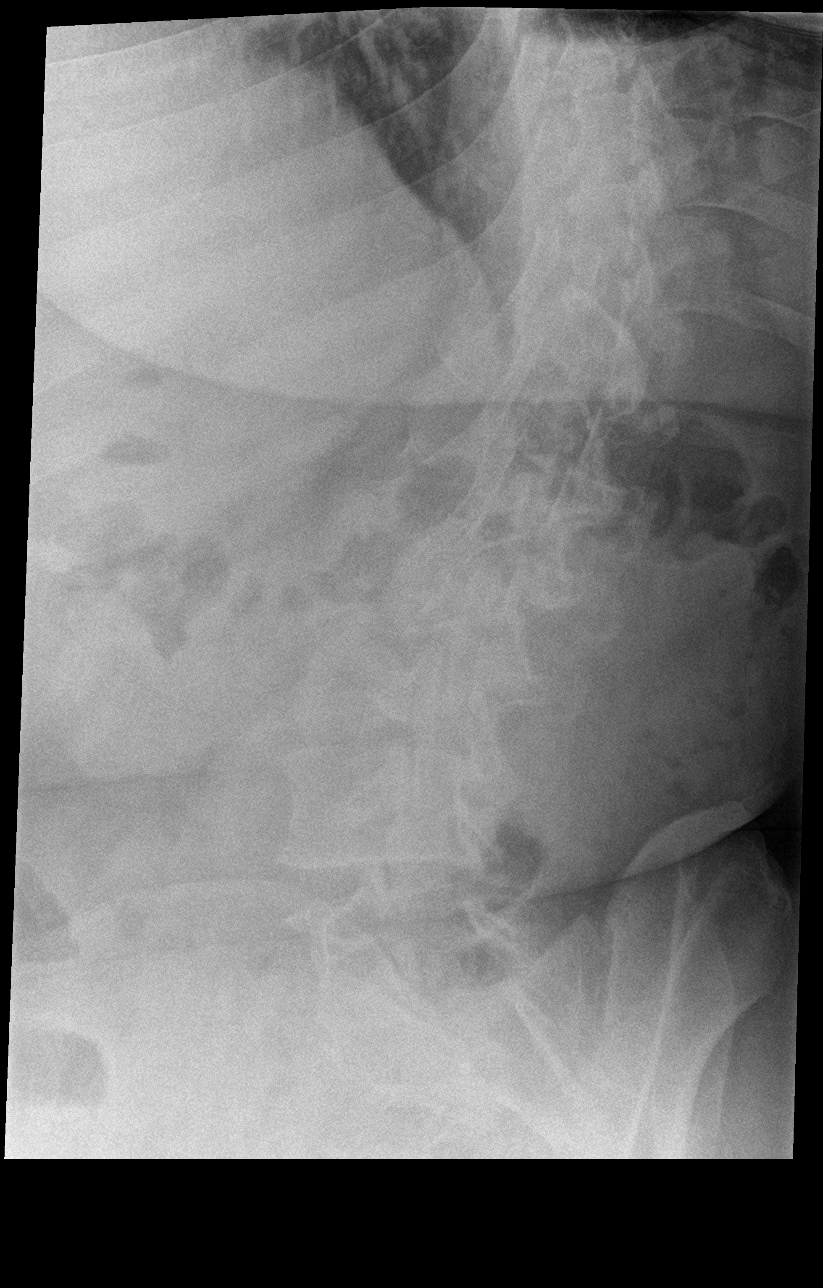

[l-spine lat]
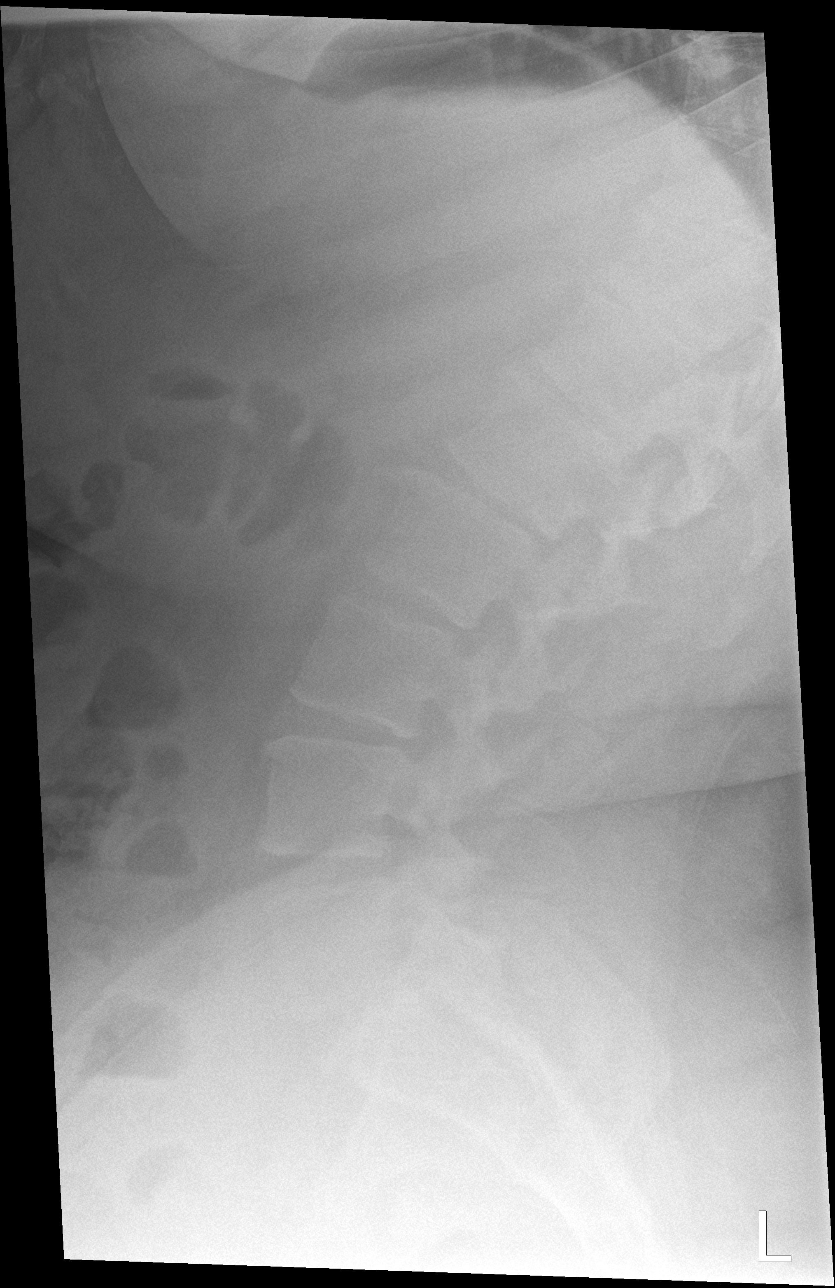

[l-spine spot]
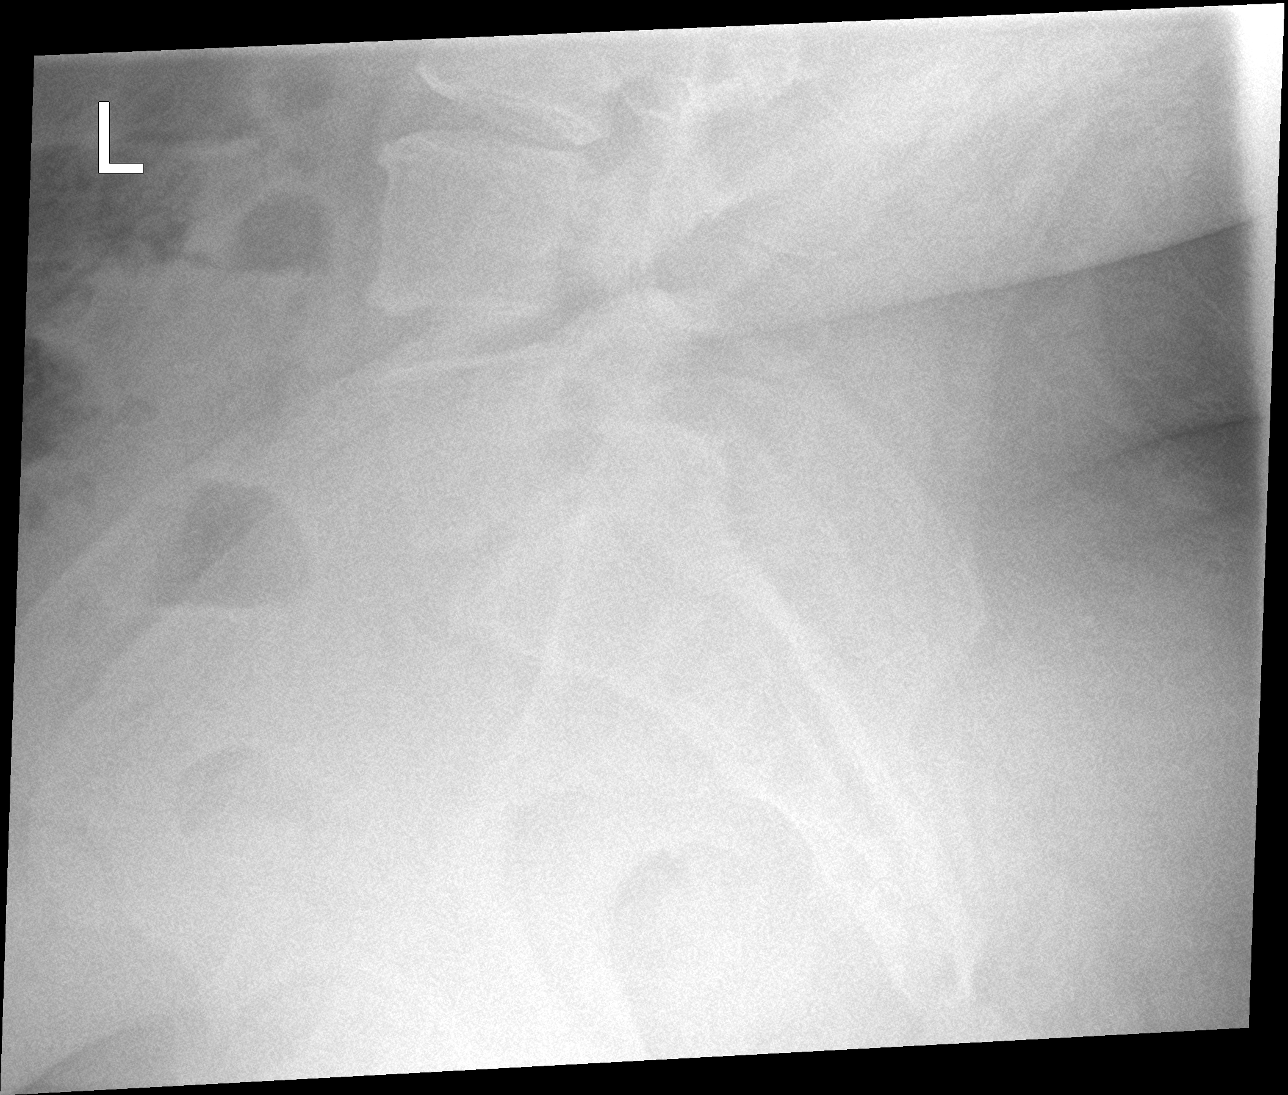

[l-spine obl (2 of 2)]
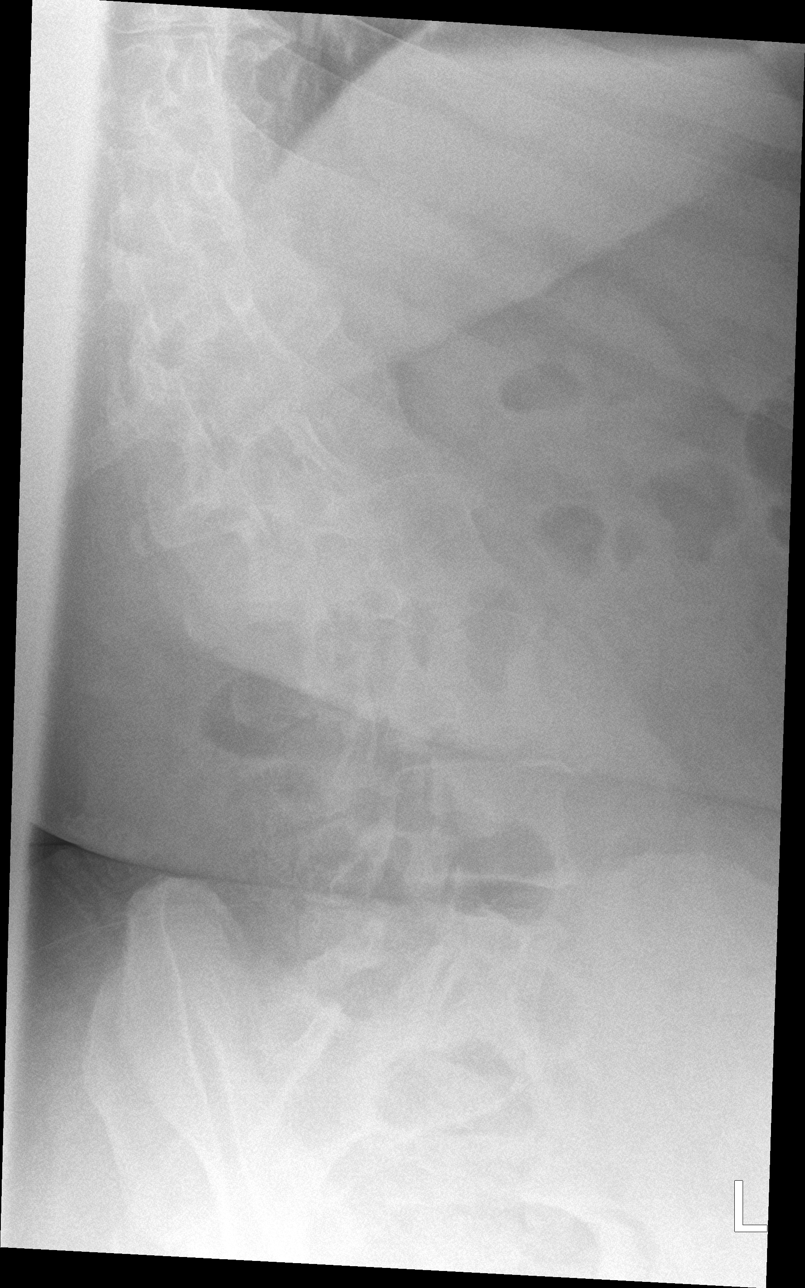

[5 of 5 positions shown; findings below may reference images not displayed]

FINDINGS: There is no evidence of acute fracture or subluxation.

Mild multilevel degenerative disc disease/spondylosis noted.

No focal bony lesions or spondylolysis present.
IMPRESSION: Mild multilevel degenerative disc disease/spondylosis.

## 2023-02-26 DIAGNOSIS — S93402A Sprain of unspecified ligament of left ankle, initial encounter: Secondary | ICD-10-CM | POA: Diagnosis not present

## 2023-06-12 ENCOUNTER — Ambulatory Visit: Payer: 59 | Admitting: Family Medicine

## 2023-06-12 VITALS — BP 142/90 | HR 65 | Temp 97.9°F | Ht 73.0 in | Wt >= 6400 oz

## 2023-06-12 DIAGNOSIS — Z0001 Encounter for general adult medical examination with abnormal findings: Secondary | ICD-10-CM | POA: Diagnosis not present

## 2023-06-12 DIAGNOSIS — I10 Essential (primary) hypertension: Secondary | ICD-10-CM

## 2023-06-12 DIAGNOSIS — Z13220 Encounter for screening for lipoid disorders: Secondary | ICD-10-CM | POA: Diagnosis not present

## 2023-06-12 LAB — LIPID PANEL
Cholesterol: 165 mg/dL (ref 0–200)
HDL: 49.4 mg/dL (ref >39.00)
LDL Cholesterol: 95 mg/dL (ref 0–99)
NonHDL: 115.84
Total CHOL/HDL Ratio: 3
Triglycerides: 104 mg/dL (ref 0.0–149.0)
VLDL: 20.8 mg/dL (ref 0.0–40.0)

## 2023-06-12 LAB — COMPREHENSIVE METABOLIC PANEL
ALT: 15 U/L (ref 0–53)
AST: 16 U/L (ref 0–37)
Albumin: 4.6 g/dL (ref 3.5–5.2)
Alkaline Phosphatase: 53 U/L (ref 39–117)
BUN: 11 mg/dL (ref 6–23)
CO2: 31 meq/L (ref 19–32)
Calcium: 9.6 mg/dL (ref 8.4–10.5)
Chloride: 99 meq/L (ref 96–112)
Creatinine, Ser: 0.78 mg/dL (ref 0.40–1.50)
GFR: 111.79 mL/min (ref >60.00)
Glucose, Bld: 100 mg/dL — ABNORMAL HIGH (ref 70–99)
Potassium: 4.6 meq/L (ref 3.5–5.1)
Sodium: 138 meq/L (ref 135–145)
Total Bilirubin: 0.7 mg/dL (ref 0.2–1.2)
Total Protein: 7.3 g/dL (ref 6.0–8.3)

## 2023-06-12 LAB — HEMOGLOBIN A1C: Hgb A1c MFr Bld: 5.8 % (ref 4.6–6.5)

## 2023-06-12 NOTE — Assessment & Plan Note (Signed)
 Physical exam completed.  Encouraged healthy diet and exercise.  Discussed trying to add in some exercise outside of work.  Patient declines flu vaccine and further COVID vaccines.  Encouraged smoking cessation.  Advised if he is going to smoke he needs to minimize this.  Lab work as outlined.

## 2023-06-12 NOTE — Progress Notes (Signed)
 Camellia Her, MD Phone: [phone]  Arthur Oconnor is a 43 y.o. male who presents today for CPE.  Diet: Diet is similar to prior, he has salad for lunch, eating more vegetables, some sugary food intake, not many fried foods, no soda or sweet tea Exercise: Very active at work with walking and climbing stairs Family history-  Prostate cancer: no  Colon cancer: maternal grandfather, notes was related to working with lead based paint Vaccines-   Flu: declines  Tetanus: UTD  COVID19: x2 HIV screening: reports had in the past Hep C Screening: reports had in the past Tobacco use: notes rare cigar Alcohol use: no Illicit Drug use: no Dentist: yes Ophthalmology: yes   Active Ambulatory Problems    Diagnosis Date Noted   Essential hypertension 03/28/2016   Asthma 03/20/2017   Morbid obesity (HCC) 03/20/2017   GERD (gastroesophageal reflux disease) 02/27/2020   Carpal tunnel syndrome 01/13/2022   Varicose veins of left lower extremity 01/13/2022   Encounter for general adult medical examination with abnormal findings 06/06/2022   Laceration of finger 06/06/2022   Resolved Ambulatory Problems    Diagnosis Date Noted   Epigastric abdominal pain 05/28/2019   Low back pain 12/02/2020   Abnormal urinalysis 12/02/2020   Vasectomy evaluation 05/16/2021   Past Medical History:  Diagnosis Date   Arthritis    Chicken pox    Hay fever    Hypertension    Kidney stones     Family History  Problem Relation Age of Onset   Multiple myeloma Father    Arthritis Maternal Grandmother    Arthritis Paternal Grandmother     Social History   Socioeconomic History   Marital status: Married    Spouse name: Not on file   Number of children: Not on file   Years of education: Not on file   Highest education level: Not on file  Occupational History   Not on file  Tobacco Use   Smoking status: Never   Smokeless tobacco: Former  Substance and Sexual Activity   Alcohol use:  Yes    Comment: once a month    Drug use: No   Sexual activity: Not on file  Other Topics Concern   Not on file  Social History Narrative   Works as a librarian, academic.    Social Drivers of Health   Financial Resource Strain: Patient Declined (06/11/2023)   Overall Financial Resource Strain (CARDIA)    Difficulty of Paying Living Expenses: Patient declined  Food Insecurity: Patient Declined (06/11/2023)   Hunger Vital Sign    Worried About Running Out of Food in the Last Year: Patient declined    Ran Out of Food in the Last Year: Patient declined  Transportation Needs: Patient Declined (06/11/2023)   PRAPARE - Administrator, Civil Service (Medical): Patient declined    Lack of Transportation (Non-Medical): Patient declined  Physical Activity: Unknown (06/11/2023)   Exercise Vital Sign    Days of Exercise per Week: Patient declined    Minutes of Exercise per Session: Not on file  Stress: Patient Declined (06/11/2023)   [employer] of Occupational Health - Occupational Stress Questionnaire    Feeling of Stress : Patient declined  Social Connections: Unknown (06/11/2023)   Social Connection and Isolation Panel [NHANES]    Frequency of Communication with Friends and Family: Patient declined    Frequency of Social Gatherings with Friends and Family: Patient declined    Attends Religious Services: Patient declined  Active Member of Clubs or Organizations: Patient declined    Attends Banker Meetings: Not on file    Marital Status: Patient declined  Intimate Partner Violence: Not on file    ROS  General:  Negative for nexplained weight loss, fever Skin: Negative for new or changing mole, sore that won't heal HEENT: Negative for trouble hearing, trouble seeing, ringing in ears, mouth sores, hoarseness, change in voice, dysphagia. CV:  Negative for chest pain, dyspnea, edema, palpitations Resp: Negative for cough, dyspnea, hemoptysis GI: Negative for  nausea, vomiting, diarrhea, constipation, abdominal pain, melena, hematochezia. GU: Negative for dysuria, incontinence, urinary hesitance, hematuria, vaginal or penile discharge, polyuria, sexual difficulty, lumps in testicle or breasts MSK: Negative for muscle cramps or aches, joint pain or swelling Neuro: Negative for headaches, weakness, numbness, dizziness, passing out/fainting Psych: Negative for depression, anxiety, memory problems  Objective  Physical Exam Vitals:   06/12/23 0855 06/12/23 0916  BP: (!) 142/94 (!) 142/90  Pulse:    Temp:    SpO2:      BP Readings from Last 3 Encounters:  06/12/23 (!) 142/90  01/19/23 128/80  06/06/22 130/70   Wt Readings from Last 3 Encounters:  06/12/23 (!) 444 lb 12.8 oz (201.8 kg)  01/19/23 (!) 437 lb (198.2 kg)  06/06/22 (!) 436 lb 6.4 oz (197.9 kg)    Physical Exam Constitutional:      General: He is not in acute distress.    Appearance: He is not diaphoretic.  HENT:     Head: Normocephalic and atraumatic.  Cardiovascular:     Rate and Rhythm: Normal rate and regular rhythm.     Heart sounds: Normal heart sounds.  Pulmonary:     Effort: Pulmonary effort is normal.     Breath sounds: Normal breath sounds.  Abdominal:     General: Bowel sounds are normal. There is no distension.     Palpations: Abdomen is soft.     Tenderness: There is no abdominal tenderness.  Musculoskeletal:     Right lower leg: No edema.     Left lower leg: No edema.  Lymphadenopathy:     Cervical: No cervical adenopathy.  Skin:    General: Skin is warm and dry.  Neurological:     Mental Status: He is alert.  Psychiatric:        Mood and Affect: Mood normal.      Assessment/Plan:   Encounter for general adult medical examination with abnormal findings Assessment & Plan: Physical exam completed.  Encouraged healthy diet and exercise.  Discussed trying to add in some exercise outside of work.  Patient declines flu vaccine and further COVID  vaccines.  Encouraged smoking cessation.  Advised if he is going to smoke he needs to minimize this.  Lab work as outlined.   Essential hypertension Assessment & Plan: Blood pressure elevated today.  Previously had been adequately controlled.  Patient will start checking at home.  He will follow-up in 3 months for recheck.  Discussed if blood pressure is consistently elevated at home we may have to alter his medication regimen.  He will continue lisinopril -HCTZ 1 tablet daily.  Orders: -     Comprehensive metabolic panel  Lipid screening -     Lipid panel -     Comprehensive metabolic panel  Morbid obesity (HCC) -     Hemoglobin A1c    Return in about 3 months (around 09/10/2023) for htn transfer of care.   Camellia Her, MD [facility] [employer]

## 2023-06-12 NOTE — Assessment & Plan Note (Signed)
 Blood pressure elevated today.  Previously had been adequately controlled.  Patient will start checking at home.  He will follow-up in 3 months for recheck.  Discussed if blood pressure is consistently elevated at home we may have to alter his medication regimen.  He will continue lisinopril -HCTZ 1 tablet daily.

## 2023-06-23 ENCOUNTER — Other Ambulatory Visit: Payer: Self-pay

## 2023-06-23 ENCOUNTER — Emergency Department: Payer: 59

## 2023-06-23 ENCOUNTER — Emergency Department
Admission: EM | Admit: 2023-06-23 | Discharge: 2023-06-23 | Disposition: A | Discharge status: Home / Self Care | Payer: 59 | Attending: Emergency Medicine | Admitting: Emergency Medicine

## 2023-06-23 DIAGNOSIS — I10 Essential (primary) hypertension: Secondary | ICD-10-CM | POA: Insufficient documentation

## 2023-06-23 DIAGNOSIS — K8020 Calculus of gallbladder without cholecystitis without obstruction: Secondary | ICD-10-CM | POA: Insufficient documentation

## 2023-06-23 DIAGNOSIS — J45909 Unspecified asthma, uncomplicated: Secondary | ICD-10-CM | POA: Diagnosis not present

## 2023-06-23 DIAGNOSIS — R1011 Right upper quadrant pain: Secondary | ICD-10-CM | POA: Diagnosis not present

## 2023-06-23 HISTORY — DX: Obesity, unspecified: E66.9

## 2023-06-23 LAB — CBC
HCT: 44.1 % (ref 39.0–52.0)
Hemoglobin: 15.3 g/dL (ref 13.0–17.0)
MCH: 26.9 pg (ref 26.0–34.0)
MCHC: 34.7 g/dL (ref 30.0–36.0)
MCV: 77.5 fL — ABNORMAL LOW (ref 80.0–100.0)
Platelets: 324 10*3/uL (ref 150–400)
RBC: 5.69 MIL/uL (ref 4.22–5.81)
RDW: 12.3 % (ref 11.5–15.5)
WBC: 14.2 10*3/uL — ABNORMAL HIGH (ref 4.0–10.5)
nRBC: 0 % (ref 0.0–0.2)

## 2023-06-23 LAB — URINALYSIS, ROUTINE W REFLEX MICROSCOPIC
Bacteria, UA: NONE SEEN
Bilirubin Urine: NEGATIVE
Glucose, UA: NEGATIVE mg/dL
Ketones, ur: NEGATIVE mg/dL
Leukocytes,Ua: NEGATIVE
Nitrite: NEGATIVE
Protein, ur: NEGATIVE mg/dL
Specific Gravity, Urine: 1.004 — ABNORMAL LOW (ref 1.005–1.030)
Squamous Epithelial / HPF: 0 /[HPF] (ref 0–5)
pH: 7 (ref 5.0–8.0)

## 2023-06-23 LAB — COMPREHENSIVE METABOLIC PANEL
ALT: 22 U/L (ref 0–44)
AST: 24 U/L (ref 15–41)
Albumin: 4.8 g/dL (ref 3.5–5.0)
Alkaline Phosphatase: 46 U/L (ref 38–126)
Anion gap: 13 (ref 5–15)
BUN: 8 mg/dL (ref 6–20)
CO2: 25 mmol/L (ref 22–32)
Calcium: 9.2 mg/dL (ref 8.9–10.3)
Chloride: 97 mmol/L — ABNORMAL LOW (ref 98–111)
Creatinine, Ser: 0.76 mg/dL (ref 0.61–1.24)
GFR, Estimated: >60 mL/min (ref >60)
Glucose, Bld: 121 mg/dL — ABNORMAL HIGH (ref 70–99)
Potassium: 3.6 mmol/L (ref 3.5–5.1)
Sodium: 135 mmol/L (ref 135–145)
Total Bilirubin: 1.1 mg/dL (ref 0.0–1.2)
Total Protein: 8.7 g/dL — ABNORMAL HIGH (ref 6.5–8.1)

## 2023-06-23 LAB — LIPASE, BLOOD: Lipase: 33 U/L (ref 11–51)

## 2023-06-23 MED ORDER — ONDANSETRON 4 MG PO TBDP: 4.0000 mg | ORAL_TABLET | Freq: Three times a day (TID) | ORAL | 0 refills | Status: AC | PRN

## 2023-06-23 MED ORDER — HYDROCODONE-ACETAMINOPHEN 5-325 MG PO TABS: 1.0000 | ORAL_TABLET | Freq: Three times a day (TID) | ORAL | 0 refills | Status: AC | PRN

## 2023-06-23 NOTE — ED Triage Notes (Signed)
Pt to ED for RUQ pain since last night. States heat helps. Has gallbladder. Pain is 4/10, dull. Hx gallbladder attacks. Was on a med at one point that raised lipase level. Pt in NAD, skin dry.

## 2023-06-23 NOTE — ED Provider Notes (Signed)
[facility] Emergency Department Provider Note     Event Date/Time   First MD Initiated Contact with Patient 06/23/23 1703     (approximate)   History   Abdominal Pain (RUQ)   HPI  Arthur Oconnor is a 43 y.o. male with a history of HTN, GERD, obesity, and asthma, presents to the ED for evaluation of intermittent right upper quadrant pain.  Patient reports his most recent attack was last night.  He reports warm compresses to help alleviate his symptoms.  He does have a history of gallstones and prior gallbladder attacks.  Patient denies any active nausea, vomiting, or diarrhea.  He is able tolerate p.o. at this time.  No reports of any fevers, chills, or sweats.  He notes his pain is improved to a 2 out of 10 at the time of this evaluation.  Physical Exam   Triage Vital Signs: ED Triage Vitals  Encounter Vitals Group     BP 06/23/23 1424 (!) 150/14     Systolic BP Percentile --      Diastolic BP Percentile --      Pulse Rate 06/23/23 1424 89     Resp 06/23/23 1424 20     Temp 06/23/23 1424 98.7 F (37.1 C)     Temp Source 06/23/23 1424 Oral     SpO2 06/23/23 1424 95 %     Weight 06/23/23 1420 (!) 443 lb 2 oz (201 kg)     Height 06/23/23 1420 6\' 1"  (1.854 m)     Head Circumference --      Peak Flow --      Pain Score --      Pain Loc --      Pain Education --      Exclude from Growth Chart --     Most recent vital signs: Vitals:   06/23/23 1424  BP: (!) 150/14  Pulse: 89  Resp: 20  Temp: 98.7 F (37.1 C)  SpO2: 95%    General Awake, no distress. NAD HEENT NCAT. PERRL. EOMI. No rhinorrhea. Mucous membranes are moist.  CV:  Good peripheral perfusion. RRR RESP:  Normal effort. CTA ABD:  No distention.  Soft and nontender.  No rebound, guarding, or rigidity appreciated.   ED Results / Procedures / Treatments   Labs (all labs ordered are listed, but only abnormal results are displayed) Labs Reviewed  COMPREHENSIVE  METABOLIC PANEL - Abnormal; Notable for the following components:      Result Value   Chloride 97 (*)    Glucose, Bld 121 (*)    Total Protein 8.7 (*)    All other components within normal limits  CBC - Abnormal; Notable for the following components:   WBC 14.2 (*)    MCV 77.5 (*)    All other components within normal limits  URINALYSIS, ROUTINE W REFLEX MICROSCOPIC - Abnormal; Notable for the following components:   Color, Urine STRAW (*)    APPearance CLEAR (*)    Specific Gravity, Urine 1.004 (*)    Hgb urine dipstick MODERATE (*)    All other components within normal limits  LIPASE, BLOOD     EKG   RADIOLOGY  I personally viewed and evaluated these images as part of my medical decision making, as well as reviewing the written report by the radiologist.  ED Provider Interpretation: cholelithiasis   US ABDOMEN LIMITED RUQ (LIVER/GB) Result Date: 06/23/2023 CLINICAL DATA:  Right upper quadrant pain for 1 day  EXAM: ULTRASOUND ABDOMEN LIMITED RIGHT UPPER QUADRANT COMPARISON:  None Available. FINDINGS: Gallbladder: Shadowing gallstones are seen layering dependently within the gallbladder, measuring up to 3.2 cm in size. No gallbladder wall thickening or pericholecystic fluid. Negative sonographic Murphy sign. Common bile duct: Diameter: 5 mm Liver: No focal lesion identified. Within normal limits in parenchymal echogenicity. Portal vein is patent on color Doppler imaging with normal direction of blood flow towards the liver. Other: None. IMPRESSION: 1. Cholelithiasis without evidence of acute cholecystitis. Electronically Signed   By: Sharlet Salina M.D.   On: 06/23/2023 16:41     PROCEDURES:  Critical Care performed: No  Procedures   MEDICATIONS ORDERED IN ED: Medications - No data to display   IMPRESSION / MDM / ASSESSMENT AND PLAN / ED COURSE  I reviewed the triage vital signs and the nursing notes.                              Differential diagnosis includes, but  is not limited to, biliary disease (biliary colic, acute cholecystitis, cholangitis, choledocholithiasis, etc), intrathoracic causes for epigastric abdominal pain including ACS, gastritis, duodenitis, pancreatitis, small bowel or large bowel obstruction, abdominal aortic aneurysm, hernia, and ulcer(s).  Patient's presentation is most consistent with acute complicated illness / injury requiring diagnostic workup.  Patient's diagnosis is consistent with cholelithiasis without evidence of acute cholecystitis.  Patient presents to the ED in no acute distress.  He endorses onset of epigastric pain last night, consistent with his history of gallbladder attacks.  Lab review reveals an elevated white blood cell count of 14, but no electrolyte abnormalities.  Lipase is flat at 33.  No evidence of UTI.   Patient's ultrasound does confirm  cholelithiasis without evidence of acute cholecystitis. Patient will be discharged home with prescriptions for hydrocodone and Zofran. Patient is to follow up with general surgery for nonemergent surgical intervention as a lap chole is likely an appropriate next step.  As needed or otherwise directed. Patient is given ED precautions to return to the ED for any worsening or new symptoms.     FINAL CLINICAL IMPRESSION(S) / ED DIAGNOSES   Final diagnoses:  Calculus of gallbladder without cholecystitis without obstruction     Rx / DC Orders   ED Discharge Orders     None        Note:  This document was prepared using Dragon voice recognition software and may include unintentional dictation errors.    Lissa Hoard, PA-C 06/23/23 1749    Merwyn Katos, MD 06/23/23 [phone]

## 2023-06-23 NOTE — Discharge Instructions (Addendum)
Your exam and labs are consistent with gallstones without evidence of an acutely infected gallbladder.  Symptoms may wax and wane, until you have a doctor lower fat eating plan.  You should follow-up with Dr. Maia Plan and general surgery, as surgical removal of the gallbladder is likely the most reasonable intervention.  Take the prescription nausea medicine and pain medicine as needed.

## 2023-07-02 ENCOUNTER — Other Ambulatory Visit: Payer: Self-pay

## 2023-07-03 DIAGNOSIS — L299 Pruritus, unspecified: Secondary | ICD-10-CM | POA: Diagnosis not present

## 2023-07-03 DIAGNOSIS — D225 Melanocytic nevi of trunk: Secondary | ICD-10-CM | POA: Diagnosis not present

## 2023-07-03 DIAGNOSIS — D235 Other benign neoplasm of skin of trunk: Secondary | ICD-10-CM | POA: Diagnosis not present

## 2023-07-03 DIAGNOSIS — D485 Neoplasm of uncertain behavior of skin: Secondary | ICD-10-CM | POA: Diagnosis not present

## 2023-07-03 DIAGNOSIS — L309 Dermatitis, unspecified: Secondary | ICD-10-CM | POA: Diagnosis not present

## 2023-07-03 DIAGNOSIS — L918 Other hypertrophic disorders of the skin: Secondary | ICD-10-CM | POA: Diagnosis not present

## 2023-07-03 DIAGNOSIS — D1801 Hemangioma of skin and subcutaneous tissue: Secondary | ICD-10-CM | POA: Diagnosis not present

## 2023-08-22 ENCOUNTER — Telehealth: Payer: Self-pay | Admitting: Family Medicine

## 2023-08-22 NOTE — Telephone Encounter (Signed)
 Dr Clent Ridges is leaving the practice and your New Patient or Transfer of Care appointment needs to be rescheduled with another provider. Please call the office to schedule a Transfer of Care to either Dr Charlann Lange, Darleen Crocker or Kara Dies, NP.  E2C2, please reschedule this patient's TOC visit. -KH   Thank you

## 2023-09-24 ENCOUNTER — Encounter: Payer: 59 | Admitting: Family Medicine

## 2024-01-16 ENCOUNTER — Other Ambulatory Visit: Payer: Self-pay

## 2024-01-16 DIAGNOSIS — Z13220 Encounter for screening for lipoid disorders: Secondary | ICD-10-CM | POA: Diagnosis not present

## 2024-01-16 DIAGNOSIS — G4733 Obstructive sleep apnea (adult) (pediatric): Secondary | ICD-10-CM | POA: Diagnosis not present

## 2024-01-16 DIAGNOSIS — Z131 Encounter for screening for diabetes mellitus: Secondary | ICD-10-CM | POA: Diagnosis not present

## 2024-01-16 DIAGNOSIS — M5432 Sciatica, left side: Secondary | ICD-10-CM | POA: Diagnosis not present

## 2024-01-16 DIAGNOSIS — M5431 Sciatica, right side: Secondary | ICD-10-CM | POA: Diagnosis not present

## 2024-01-16 DIAGNOSIS — R7303 Prediabetes: Secondary | ICD-10-CM | POA: Diagnosis not present

## 2024-01-16 DIAGNOSIS — E66813 Obesity, class 3: Secondary | ICD-10-CM | POA: Diagnosis not present

## 2024-01-16 DIAGNOSIS — I10 Essential (primary) hypertension: Secondary | ICD-10-CM | POA: Diagnosis not present

## 2024-01-16 MED ORDER — LISINOPRIL-HYDROCHLOROTHIAZIDE 20-12.5 MG PO TABS
1.0000 | ORAL_TABLET | Freq: Every day | ORAL | 3 refills | Status: AC
  Filled 2024-01-16 – 2024-01-25 (×3): qty 30, 30d supply, fill #0
  Filled 2024-02-26: qty 30, 30d supply, fill #1
  Filled 2024-03-25: qty 30, 30d supply, fill #2
  Filled 2024-04-24: qty 30, 30d supply, fill #3
  Filled 2024-05-26: qty 30, 30d supply, fill #4
  Filled 2024-06-25: qty 30, 30d supply, fill #5

## 2024-01-25 ENCOUNTER — Other Ambulatory Visit: Payer: Self-pay

## 2024-02-14 ENCOUNTER — Other Ambulatory Visit: Payer: Self-pay

## 2024-02-14 MED ORDER — BUDESONIDE-FORMOTEROL FUMARATE 80-4.5 MCG/ACT IN AERO
2.0000 | INHALATION_SPRAY | Freq: Two times a day (BID) | RESPIRATORY_TRACT | 0 refills | Status: AC
  Filled 2024-02-14: qty 10.2, 30d supply, fill #0

## 2024-02-14 MED ORDER — ALBUTEROL SULFATE HFA 108 (90 BASE) MCG/ACT IN AERS
2.0000 | INHALATION_SPRAY | Freq: Four times a day (QID) | RESPIRATORY_TRACT | 0 refills | Status: AC | PRN
  Filled 2024-02-14: qty 6.7, 30d supply, fill #0

## 2024-06-25 ENCOUNTER — Other Ambulatory Visit: Payer: Self-pay
